# Patient Record
Sex: Female | Born: 1940
Health system: Southern US, Community
[De-identification: ages and names within clinical notes are randomized; demographics above are authoritative.]

## PROBLEM LIST (undated history)

## (undated) DIAGNOSIS — T7840XA Allergy, unspecified, initial encounter: Secondary | ICD-10-CM

## (undated) DIAGNOSIS — F329 Major depressive disorder, single episode, unspecified: Secondary | ICD-10-CM

## (undated) DIAGNOSIS — D649 Anemia, unspecified: Secondary | ICD-10-CM

## (undated) DIAGNOSIS — F419 Anxiety disorder, unspecified: Secondary | ICD-10-CM

## (undated) DIAGNOSIS — E785 Hyperlipidemia, unspecified: Secondary | ICD-10-CM

## (undated) DIAGNOSIS — F32A Depression, unspecified: Secondary | ICD-10-CM

## (undated) DIAGNOSIS — Z8719 Personal history of other diseases of the digestive system: Secondary | ICD-10-CM

## (undated) HISTORY — DX: Hyperlipidemia, unspecified: E78.5

## (undated) HISTORY — PX: POLYPECTOMY: SHX149

## (undated) HISTORY — PX: COLONOSCOPY: SHX174

## (undated) HISTORY — DX: Personal history of other diseases of the digestive system: Z87.19

## (undated) HISTORY — DX: Depression, unspecified: F32.A

## (undated) HISTORY — DX: Anxiety disorder, unspecified: F41.9

## (undated) HISTORY — DX: Anemia, unspecified: D64.9

## (undated) HISTORY — DX: Major depressive disorder, single episode, unspecified: F32.9

## (undated) HISTORY — DX: Allergy, unspecified, initial encounter: T78.40XA

---
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
## 1971-12-23 HISTORY — PX: APPENDECTOMY: SHX54

## 1971-12-23 HISTORY — PX: OVARIAN CYST SURGERY: SHX726

## 1999-04-24 ENCOUNTER — Other Ambulatory Visit: Admission: RE | Admit: 1999-04-24 | Discharge: 1999-04-24 | Payer: Self-pay | Admitting: Endocrinology

## 2001-07-19 ENCOUNTER — Ambulatory Visit (HOSPITAL_COMMUNITY): Admission: RE | Admit: 2001-07-19 | Discharge: 2001-07-19 | Payer: Self-pay | Admitting: Endocrinology

## 2001-07-19 ENCOUNTER — Encounter: Payer: Self-pay | Admitting: Endocrinology

## 2001-09-01 ENCOUNTER — Ambulatory Visit (HOSPITAL_COMMUNITY): Admission: RE | Admit: 2001-09-01 | Discharge: 2001-09-01 | Payer: Self-pay | Admitting: *Deleted

## 2001-09-01 ENCOUNTER — Encounter (INDEPENDENT_AMBULATORY_CARE_PROVIDER_SITE_OTHER): Payer: Self-pay | Admitting: *Deleted

## 2003-07-28 ENCOUNTER — Other Ambulatory Visit: Admission: RE | Admit: 2003-07-28 | Discharge: 2003-07-28 | Payer: Self-pay | Admitting: Obstetrics and Gynecology

## 2004-08-16 ENCOUNTER — Observation Stay (HOSPITAL_COMMUNITY): Admission: EM | Admit: 2004-08-16 | Discharge: 2004-08-19 | Payer: Self-pay | Admitting: Emergency Medicine

## 2004-08-28 ENCOUNTER — Encounter: Admission: RE | Admit: 2004-08-28 | Discharge: 2004-08-28 | Payer: Self-pay | Admitting: Endocrinology

## 2004-09-11 ENCOUNTER — Encounter: Payer: Self-pay | Admitting: Family Medicine

## 2004-10-17 ENCOUNTER — Encounter (INDEPENDENT_AMBULATORY_CARE_PROVIDER_SITE_OTHER): Payer: Self-pay | Admitting: *Deleted

## 2004-10-17 ENCOUNTER — Ambulatory Visit (HOSPITAL_COMMUNITY): Admission: RE | Admit: 2004-10-17 | Discharge: 2004-10-18 | Payer: Self-pay | Admitting: General Surgery

## 2005-06-18 ENCOUNTER — Other Ambulatory Visit: Admission: RE | Admit: 2005-06-18 | Discharge: 2005-06-18 | Payer: Self-pay | Admitting: Obstetrics and Gynecology

## 2006-04-29 IMAGING — CR DG CHEST 1V PORT
1 series · 1 of 1 positions shown · non-contrast
Comparison: None.

CLINICAL DATA: Chest pain.  
 PORTABLE CHEST 08/16/04

[view not recorded]
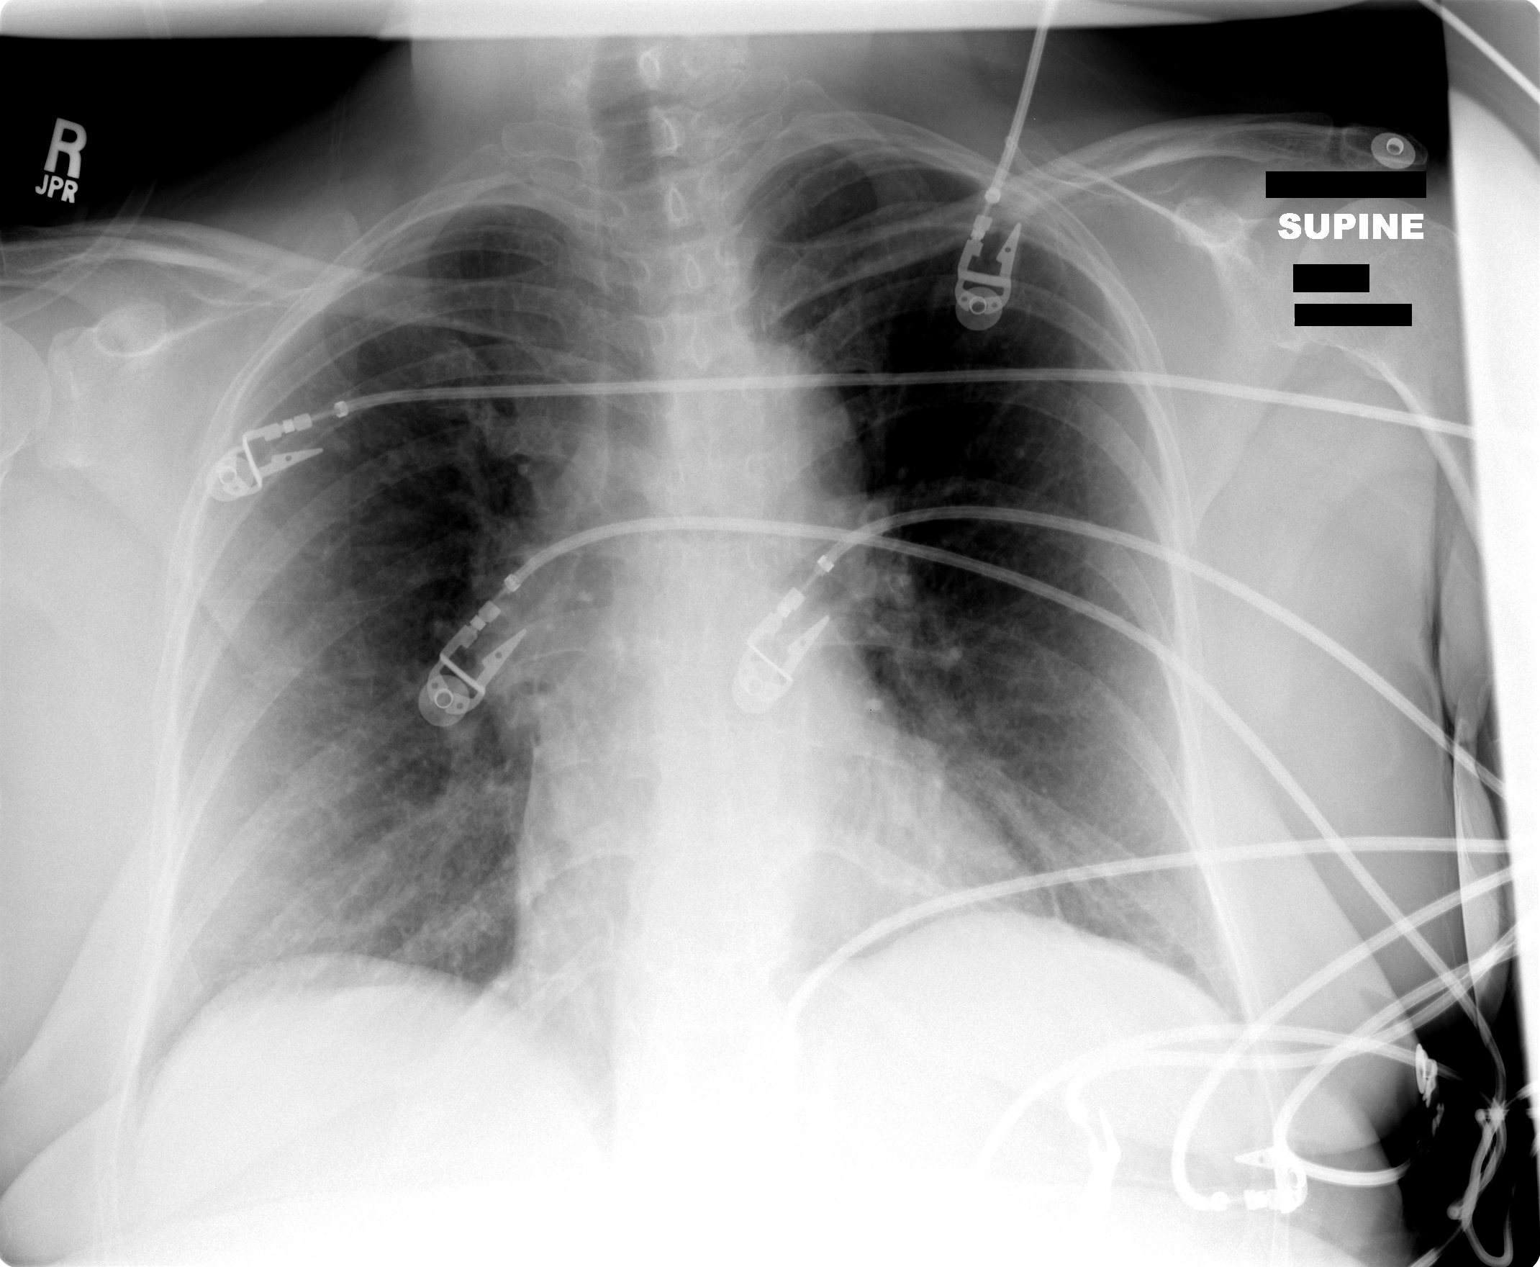

[1 of 1 positions shown; findings below may reference images not displayed]

FINDINGS: AP chest obtained at 1966 hours shows low volumes.  There is mild vascular congestion without focal infiltrate or pleural effusion.  Ill-defined opacity projects over the left heart border which may be external to the patient or related to rotation.  Cardio-pericardial silhouette is within normal limits for size.  Multiple telemetry leads overlie the chest. 
 IMPRESSION
 1.  Low volume rotated film.  Vascular congestion without frank pulmonary edema. 
 2.  Ill-defined density over the left heart.  
 3.  Follow-up two view chest x-ray is recommended to better characterize.

## 2006-12-22 HISTORY — PX: CHOLECYSTECTOMY: SHX55

## 2007-01-29 LAB — CONVERTED CEMR LAB: Pap Smear: NORMAL

## 2007-04-15 ENCOUNTER — Encounter: Payer: Self-pay | Admitting: Family Medicine

## 2008-12-06 ENCOUNTER — Emergency Department (HOSPITAL_COMMUNITY): Admission: EM | Admit: 2008-12-06 | Discharge: 2008-12-07 | Payer: Self-pay | Admitting: Emergency Medicine

## 2010-01-28 ENCOUNTER — Ambulatory Visit: Payer: Self-pay | Admitting: Family Medicine

## 2010-01-28 DIAGNOSIS — F329 Major depressive disorder, single episode, unspecified: Secondary | ICD-10-CM

## 2010-01-28 DIAGNOSIS — E785 Hyperlipidemia, unspecified: Secondary | ICD-10-CM

## 2010-01-28 DIAGNOSIS — F3289 Other specified depressive episodes: Secondary | ICD-10-CM

## 2010-01-28 DIAGNOSIS — Z8719 Personal history of other diseases of the digestive system: Secondary | ICD-10-CM

## 2010-01-28 DIAGNOSIS — M5430 Sciatica, unspecified side: Secondary | ICD-10-CM

## 2010-01-28 HISTORY — DX: Other specified depressive episodes: F32.89

## 2010-01-28 HISTORY — DX: Major depressive disorder, single episode, unspecified: F32.9

## 2010-01-28 HISTORY — DX: Hyperlipidemia, unspecified: E78.5

## 2010-01-31 ENCOUNTER — Ambulatory Visit: Payer: Self-pay | Admitting: Family Medicine

## 2010-02-01 LAB — CONVERTED CEMR LAB
Cholesterol: 251 mg/dL — ABNORMAL HIGH (ref 0–200)
HDL: 52.4 mg/dL (ref >39.00)
Total CHOL/HDL Ratio: 5
Triglycerides: 153 mg/dL — ABNORMAL HIGH (ref 0.0–149.0)
VLDL: 30.6 mg/dL (ref 0.0–40.0)

## 2011-01-23 NOTE — Assessment & Plan Note (Signed)
Summary: NEW TO EST//CCM   Vital Signs:  Patient profile:   70 year old female Menstrual status:  postmenopausal Height:      66 inches Weight:      232 pounds BMI:     37.58 Temp:     97.7 degrees F oral Pulse rate:   80 / minute Pulse rhythm:   regular Resp:     12 per minute BP sitting:   110 / 70  (left arm) Cuff size:   large  Vitals Entered By: Sid Falcon LPN (January 28, 2010 11:16 AM)  Nutrition Counseling: Patient's BMI is greater than 25 and therefore counseled on weight management options. CC: New to establish Is Patient Diabetic? No     Menstrual Status postmenopausal Last PAP Result normal   History of Present Illness: New patient to establish care.  Chronic problems include hyperlipidemia, history of IBS, past history depression. Previous rx with Zetia and stopped taking on her own. No side effects. No history of CAD. No prior use of statin. Reportedly had NMR profile at some point in the past.    IBS symptoms stable. No recent constipation or diarrhea. Appetite is good.  Remote history of depression. Denies any active depressive symptoms now.  Acute problem is intermittent left buttock pain occasionally radiating to the calf with pressure-like pain mild to moderate intensity and very intermittent. Symptoms tend to be worse early in the morning. No history of known sciatica. Denies any numbness or weakness. No loss of bladder or bowel control.  Past history otherwise reviewed and significant for cholecystectomy 2008, appendectomy 1973, tonsillectomy 1948. Family history states that for a sister with breast cancer another sister with some sort of valvular heart problem. Mother died age 40 of possible CHF. Hypertension in mother.  Patient is married has 3 children. Nonsmoker. No alcohol use.  Preventive Screening-Counseling & Management  Alcohol-Tobacco     Alcohol drinks/day: 0     Smoking Status: never  Caffeine-Diet-Exercise     Does Patient  Exercise: no  Past History:  Family History: Last updated: 01/28/2010 Family History of Arthritis Family History Breast cancer 1st degree relative, sister age 66 Heart disease, mother, paternal grandfather Family History Hypertension, mother Diabetes, maternal grandmother Crohns disease, brother  Social History: Last updated: 01/28/2010 Retired Married Never Smoked Alcohol use-no Regular exercise-no 3 pregnancies, 3 live births  Risk Factors: Alcohol Use: 0 (01/28/2010) Exercise: no (01/28/2010)  Risk Factors: Smoking Status: never (01/28/2010)  Past Medical History: Chicken pox Depression Hyperlipidemia Hay fever, allergies IBS  Past Surgical History: ruptured right ovarian cyst 1973 Appendectomy 1973 Cholecystectomy, 2008 Tonsillectomy, 1948 PMH-FH-SH reviewed for relevance  Family History: Family History of Arthritis Family History Breast cancer 1st degree relative, sister age 26 Heart disease, mother, paternal grandfather Family History Hypertension, mother Diabetes, maternal grandmother Crohns disease, brother  Social History: Retired Married Never Smoked Alcohol use-no Regular exercise-no 3 pregnancies, 3 live birthsSmoking Status:  never Does Patient Exercise:  no  Review of Systems  The patient denies anorexia, fever, weight loss, weight gain, chest pain, syncope, dyspnea on exertion, peripheral edema, prolonged cough, headaches, hemoptysis, abdominal pain, melena, hematochezia, severe indigestion/heartburn, hematuria, incontinence, muscle weakness, and depression.    Physical Exam  General:  Well-developed,well-nourished,in no acute distress; alert,appropriate and cooperative throughout examination Eyes:  pupils equal, pupils round, and pupils reactive to light.   Ears:  External ear exam shows no significant lesions or deformities.  Otoscopic examination reveals clear canals, tympanic membranes are intact bilaterally  without bulging,  retraction, inflammation or discharge. Hearing is grossly normal bilaterally. Mouth:  Oral mucosa and oropharynx without lesions or exudates.  Teeth in good repair. Neck:  No deformities, masses, or tenderness noted. Lungs:  Normal respiratory effort, chest expands symmetrically. Lungs are clear to auscultation, no crackles or wheezes. Heart:  Normal rate and regular rhythm. S1 and S2 normal without gallop, murmur, click, rub or other extra sounds. Extremities:  No clubbing, cyanosis, edema, or deformity noted with normal full range of motion of all joints.   Neurologic:  cranial nerves II-XII intact, strength normal in all extremities, and gait normal.   Skin:  Intact without suspicious lesions or rashes Cervical Nodes:  No lymphadenopathy noted   Impression & Recommendations:  Problem # 1:  HYPERLIPIDEMIA (ICD-272.4) schedule fasting labs.  Problem # 2:  DEPRESSION (ICD-311) stable off meds.  Problem # 3:  SCIATICA, LEFT (ICD-724.3) neuro exam nonfocal and currently stable.  Observe for now.  Problem # 4:  IRRITABLE BOWEL SYNDROME, HX OF (ICD-V12.79)  Patient Instructions: 1)  Schedule the following labs: 2)  lipid panel  272.4 3)  It is important that you exercise reguarly at least 20 minutes 5 times a week. If you develop chest pain, have severe difficulty breathing, or feel very tired, stop exercising immediately and seek medical attention.  4)  You need to lose weight. Consider a lower calorie diet and regular exercise.   Prevention & Chronic Care Immunizations   Influenza vaccine: Not documented    Tetanus booster: Not documented    Pneumococcal vaccine: given  (12/22/2008)    H. zoster vaccine: Not documented  Colorectal Screening   Hemoccult: Not documented    Colonoscopy: normal  (12/23/2003)  Other Screening   Pap smear: normal  (01/29/2007)    Mammogram: normal  (12/22/2006)    DXA bone density scan: Not documented   Smoking status: never   (01/28/2010)  Lipids   Total Cholesterol: Not documented   LDL: Not documented   LDL Direct: Not documented   HDL: Not documented   Triglycerides: Not documented    SGOT (AST): Not documented   SGPT (ALT): Not documented   Alkaline phosphatase: Not documented   Total bilirubin: Not documented  Self-Management Support :    Lipid self-management support: Not documented    Preventive Care Screening  Last Pneumovax:    Date:  12/22/2008    Results:  given   Pap Smear:    Date:  01/29/2007    Results:  normal   Mammogram:    Date:  12/22/2006    Results:  normal   Colonoscopy:    Date:  12/23/2003    Results:  normal

## 2011-01-30 ENCOUNTER — Encounter: Payer: Self-pay | Admitting: Family Medicine

## 2011-01-30 ENCOUNTER — Ambulatory Visit (INDEPENDENT_AMBULATORY_CARE_PROVIDER_SITE_OTHER): Payer: PRIVATE HEALTH INSURANCE | Admitting: Family Medicine

## 2011-01-30 DIAGNOSIS — R079 Chest pain, unspecified: Secondary | ICD-10-CM

## 2011-01-30 NOTE — Progress Notes (Signed)
  Subjective:    Patient ID: Heather Oneal, female    DOB: June 04, 1941, 70 y.o.   MRN: [id number]  HPI  Patient is seen with intermittent chest pains for the past 2 weeks. She relates increased stress with son who has alcohol problems. Patient has prior history of depression. No cardiac history. She relates symptoms at rest with intermittent pressure substernal without radiation. Duration can be several minutes to several hours. No clear provoking factors. Mild dyspnea not clearly related to chest pain symptoms. Denies any nausea or vomiting. Denies any appetite or weight changes. No melena. Mild indigestion.. No alleviating factors.  symptoms are nonprogressive.  No prior cardiac evaluation.   Review of Systems    Pt denies any appetite or weight change Denies hemoptysis, pleuritic pain, chronic cough, odynophagia, vomiting, abdominal pain, bloody stools, back pain Objective:   Physical Exam     Patient is alert pleasant cooperative obese  in no distress  Neck exam reveals no masses Chest wall nontender  Chest is clear to auscultation  Heart regular rhythm and rate  Abdomen is soft and nontender without mass  Extremities calves are nontender. No pitting edema Psych pt alert and oriented times 3.  Slightly depressed mood.    Assessment & Plan:  Atypical chest pain.  EKG sinus rhythm with no acute changes.  Suspect some of this may be stress related but she does have moderate risk factors for coronary disease.  Will set up nuclear stress test evaluation.

## 2011-01-30 NOTE — Patient Instructions (Signed)
Start baby aspirin one daily. Follow up immediately for any progressive shortness or breath or any worsening chest pain.

## 2011-02-05 ENCOUNTER — Other Ambulatory Visit: Payer: Self-pay | Admitting: Family Medicine

## 2011-02-10 ENCOUNTER — Encounter: Payer: Self-pay | Admitting: Cardiovascular Disease

## 2011-02-10 ENCOUNTER — Ambulatory Visit (INDEPENDENT_AMBULATORY_CARE_PROVIDER_SITE_OTHER): Payer: No Typology Code available for payment source | Admitting: Cardiovascular Disease

## 2011-02-10 DIAGNOSIS — R072 Precordial pain: Secondary | ICD-10-CM

## 2011-02-12 ENCOUNTER — Other Ambulatory Visit: Payer: Self-pay | Admitting: Cardiovascular Disease

## 2011-02-12 ENCOUNTER — Other Ambulatory Visit (INDEPENDENT_AMBULATORY_CARE_PROVIDER_SITE_OTHER): Payer: PRIVATE HEALTH INSURANCE

## 2011-02-12 ENCOUNTER — Encounter: Payer: Self-pay | Admitting: Cardiovascular Disease

## 2011-02-12 DIAGNOSIS — E785 Hyperlipidemia, unspecified: Secondary | ICD-10-CM

## 2011-02-12 LAB — LIPID PANEL
Total CHOL/HDL Ratio: 5
VLDL: 38.6 mg/dL (ref 0.0–40.0)

## 2011-02-12 LAB — HEPATIC FUNCTION PANEL
Alkaline Phosphatase: 67 U/L (ref 39–117)
Bilirubin, Direct: 0.1 mg/dL (ref 0.0–0.3)

## 2011-02-18 ENCOUNTER — Telehealth (INDEPENDENT_AMBULATORY_CARE_PROVIDER_SITE_OTHER): Payer: Self-pay | Admitting: *Deleted

## 2011-02-18 NOTE — Assessment & Plan Note (Signed)
Summary: np6/chest pain/[facility]/per terri dr. Caryl Never 2...   Visit Type:  Initial Consult Primary Provider:  Evelena Peat MD  CC:  chest pressure off and on, sometimes pain in chest, sob, and .  History of Present Illness: 70 yo female with history of hyperlipidemia who is referred today for evaluaiton of chest pressure. She tells me that she had mild CP last year. Approximately 2 months ago, she began to have an argument with her alcoholic son and began to feel chest pressure. This was substernal and and lasted for a few minutes. She has since then had almost daily episodes of this chest pressure. No associated diaphoresis or n/v. There is mild SOB. Sometimes exertional. Can last for up to 10-15 minutes. No near syncope or syncope.   Current Medications (verified): 1)  Aspirin 81 Mg Tbec (Aspirin) .... Take One Tablet By Mouth Daily 2)  Occassional Allegra or Claritin .... As Needed  Allergies (verified): No Known Drug Allergies  Past History:  Past Medical History: Reviewed history from 01/28/2010 and no changes required. Chicken pox Depression Hyperlipidemia Hay fever, allergies IBS  Past Surgical History: Reviewed history from 01/28/2010 and no changes required. ruptured right ovarian cyst 1973 Appendectomy 1973 Cholecystectomy, 2008 Tonsillectomy, 1948  Family History: Reviewed history from 01/28/2010 and no changes required. Family History of Arthritis Family History Breast cancer 1st degree relative, sister age 82 Heart disease, mother, paternal grandfather Family History Hypertension, mother Diabetes, maternal grandmother Crohns disease, brother  Mother-deceased, age 58.  Father-deceased, Parkinsons Disease 2 brothers-alive, no heart disease 2 sisters-alive, no heart disease  Social History: Reviewed history from 01/28/2010 and no changes required. Retired-Administrative Assistant Married, 3 children Never Smoked Alcohol use-no No  illicit drug use Regular exercise-no 3 pregnancies, 3 live births  Review of Systems       The patient complains of chest pain.  The patient denies fatigue, malaise, fever, weight gain/loss, vision loss, decreased hearing, hoarseness, palpitations, shortness of breath, prolonged cough, wheezing, sleep apnea, coughing up blood, abdominal pain, blood in stool, nausea, vomiting, diarrhea, heartburn, incontinence, blood in urine, muscle weakness, joint pain, leg swelling, rash, skin lesions, headache, fainting, dizziness, depression, anxiety, enlarged lymph nodes, easy bruising or bleeding, and environmental allergies.    Vital Signs:  Patient profile:   70 year old female Menstrual status:  postmenopausal Height:      66 inches Weight:      230.25 pounds BMI:     37.30 Pulse rate:   68 / minute Resp:     18 per minute BP sitting:   142 / 90  (left arm) Cuff size:   large  Vitals Entered By: Celestia Khat, CMA (February 10, 2011 11:43 AM)  Physical Exam  General:  General: Well developed, well nourished, NAD HEENT: OP clear, mucus membranes moist SKIN: warm, dry Neuro: No focal deficits Musculoskeletal: Muscle strength 5/5 all ext Psychiatric: Mood and affect normal Neck: No JVD, no carotid bruits, no thyromegaly, no lymphadenopathy. Lungs:Clear bilaterally, no wheezes, rhonci, crackles CV: RRR no murmurs, gallops rubs Abdomen: soft, NT, ND, BS present Extremities: No edema, pulses 2+.    EKG  Procedure date:  02/10/2011  Findings:      Sinus rhythm, rate 68 bpm. No ischemic changes.   Impression & Recommendations:  Problem # 1:  CHEST PAIN-PRECORDIAL (ICD-786.51)  Her risk factors for CAD include HTN, hyperlipidemia. She is having daily chest pain/pressure. Will arrange exercise myoview to exclude ischemia.    Her updated medication list  for this problem includes:    Aspirin 81 Mg Tbec (Aspirin) .Marland Kitchen... Take one tablet by mouth daily  Orders: EKG w/ Interpretation  (93000) Nuclear Stress Test (Nuc Stress Test)  Problem # 2:  HYPERLIPIDEMIA (ICD-272.4) Most recent check one year ago with LDL of 186, total chol 256. Will repeat fasting labs next week.   Patient Instructions: 1)  Your physician recommends that you schedule a follow-up appointment in: 2-3 weeks. 2)  Your physician recommends that you continue on your current medications as directed. Please refer to the Current Medication list given to you today. 3)  Your physician has requested that you have an exercise stress myoview.  For further information please visit [email or web address]  Please follow instruction sheet, as given. 4)  Your physician recommends that you return for a FASTING lipid profile: when you come for your stress test.

## 2011-02-19 ENCOUNTER — Encounter: Payer: Self-pay | Admitting: Cardiology

## 2011-02-19 ENCOUNTER — Ambulatory Visit (HOSPITAL_COMMUNITY): Payer: No Typology Code available for payment source | Attending: Cardiology

## 2011-02-19 DIAGNOSIS — R0789 Other chest pain: Secondary | ICD-10-CM

## 2011-02-19 DIAGNOSIS — R0609 Other forms of dyspnea: Secondary | ICD-10-CM

## 2011-02-19 DIAGNOSIS — R079 Chest pain, unspecified: Secondary | ICD-10-CM | POA: Insufficient documentation

## 2011-02-19 DIAGNOSIS — E785 Hyperlipidemia, unspecified: Secondary | ICD-10-CM | POA: Insufficient documentation

## 2011-02-20 ENCOUNTER — Telehealth: Payer: Self-pay | Admitting: Cardiovascular Disease

## 2011-02-27 NOTE — Assessment & Plan Note (Signed)
Summary: r/s myoview wt 230 ok per [facility] ht 5'7[facility]...  Nuclear Med Background Indications for Stress Test: Evaluation for Ischemia   History: Heart Catheterization  History Comments: '05 Cath:normal, EF=65%  Symptoms: Chest Pain, Chest Pain with Exertion, Chest Pressure, Chest Pressure with Exertion, Fatigue  Symptoms Comments: CP few seconds duration prior walking treadmill.   Nuclear Pre-Procedure Cardiac Risk Factors: Family History - CAD, Lipids Caffeine/Decaff Intake: None NPO After: 9:00 PM Lungs: Clear IV 0.9% NS with Angio Cath: 18g     IV Site: R Antecubital IV Started by: Stanton Kidney, EMT-P Chest Size (in) 40     Cup Size B     Height (in): 67 Weight (lb): 229 BMI: 36.00  Nuclear Med Study 1 or 2 day study:  1 day     Stress Test Type:  Stress Reading MD:  Marca Ancona, MD     Referring MD:  Alyson Ingles Resting Radionuclide:  Technetium 64m Tetrofosmin     Resting Radionuclide Dose:  11.0 mCi  Stress Radionuclide:  Technetium 33m Tetrofosmin     Stress Radionuclide Dose:  33.0 mCi   Stress Protocol Exercise Time (min):  6:00 min     Max HR:  139 bpm     Predicted Max HR:  151 bpm  Max Systolic BP: 169 mm Hg     Percent Max HR:  92.05 %     METS: 7.0 Rate Pressure Product:  95284    Stress Test Technologist:  Irean Hong,  RN     Nuclear Technologist:  Domenic Polite, CNMT  Rest Procedure  Myocardial perfusion imaging was performed at rest 45 minutes following the intravenous administration of Technetium 14m Tetrofosmin.  Stress Procedure  The patient exercised for six minutes, RPE=15.   The patient stopped due to DOE  and denied any chest pain.  There were nonspecific ST-T wave changes.  Technetium 21m Tetrofosmin was injected at peak exercise and myocardial perfusion imaging was performed after a brief delay.  QPS Raw Data Images:  Normal; no motion artifact; normal heart/lung ratio. Stress Images:  Normal homogeneous uptake in all  areas of the myocardium. Rest Images:  Normal homogeneous uptake in all areas of the myocardium. Subtraction (SDS):  There is no evidence of scar or ischemia. Transient Ischemic Dilatation:  0.90  (Normal <1.22)  Lung/Heart Ratio:  0.30  (Normal <0.45)  Quantitative Gated Spect Images QGS EDV:  76 ml QGS ESV:  20 ml QGS EF:  74 % QGS cine images:  Normal wall motion   Overall Impression  Exercise Capacity: Fair exercise capacity. BP Response: Normal blood pressure response. Clinical Symptoms: Exertional dyspnea ECG Impression: Insignificant upsloping ST segment depression. Overall Impression: Normal stress nuclear study.  Appended Document: r/s myoview wt 230 ok per [facility] 5'7[facility]... Normal stress. no ischemia. can we let her know? thanks, cdm  Appended Document: r/s myoview wt 230 ok per [facility] 5'7[facility]... LVMTCB*  Appended Document: r/s myoview wt 230 ok per [facility] 5'7[facility]... pt aware*

## 2011-02-27 NOTE — Progress Notes (Signed)
Summary: pt rtn call to whitney  Phone Note Call from Patient   Caller: Patient [phone] Reason for Call: Talk to Nurse Summary of Call: pt rtn call to [facility] Initial call taken by: Glynda Jaeger,  February 20, 2011 2:13 PM  Follow-up for Phone Call        patient aware of test results* Whitney Maeola Sarah RN  February 20, 2011 2:24 PM  Follow-up by: Whitney Maeola Sarah RN,  February 20, 2011 2:24 PM

## 2011-02-27 NOTE — Progress Notes (Signed)
Summary: Nuclear pre procedure  Phone Note Outgoing Call Call back at [facility] Phone [phone]   Call placed by: Rea College, CMA,  February 18, 2011 5:33 PM Call placed to: Patient Summary of Call: Reviewed information on Myoview Information Sheet (see scanned document for further details).  Spoke with patient.      Nuclear Med Background Indications for Stress Test: Evaluation for Ischemia   History: Heart Catheterization  History Comments: '05 Cath:normal, EF=65%  Symptoms: Chest Pain, Chest Pressure    Nuclear Pre-Procedure Cardiac Risk Factors: Family History - CAD, Lipids Height (in): 66

## 2011-03-03 ENCOUNTER — Telehealth: Payer: Self-pay | Admitting: Family Medicine

## 2011-03-03 NOTE — Telephone Encounter (Signed)
Please advise, OV?

## 2011-03-03 NOTE — Telephone Encounter (Signed)
Pt called and has hurt her lower back. Pt is req that Dr Caryl Never call in a med or something for muscle pain. Pls call in to [facility] on [address]

## 2011-03-03 NOTE — Telephone Encounter (Signed)
OK to call in Tramadol 50 mg 1-2 po q 6 hours prn, disp #60 with no refills and needs follow up if no better with that.

## 2011-03-04 ENCOUNTER — Telehealth: Payer: Self-pay | Admitting: *Deleted

## 2011-03-04 ENCOUNTER — Ambulatory Visit: Payer: PRIVATE HEALTH INSURANCE | Admitting: Cardiovascular Disease

## 2011-03-04 ENCOUNTER — Ambulatory Visit (INDEPENDENT_AMBULATORY_CARE_PROVIDER_SITE_OTHER): Payer: No Typology Code available for payment source | Admitting: Family Medicine

## 2011-03-04 ENCOUNTER — Encounter: Payer: Self-pay | Admitting: Family Medicine

## 2011-03-04 DIAGNOSIS — S335XXA Sprain of ligaments of lumbar spine, initial encounter: Secondary | ICD-10-CM

## 2011-03-04 MED ORDER — TRAMADOL HCL 50 MG PO TABS
ORAL_TABLET | ORAL | Status: DC
Start: 2011-03-04 — End: 2011-04-10

## 2011-03-04 NOTE — Progress Notes (Signed)
  Subjective:    Patient ID: Heather Oneal, female    DOB: March 13, 1941, 70 y.o.   MRN: [id number]  HPI  patient seen acute visit. Bending over yesterday flexing her back and felt a popping sensation. Sharp pain which started at that time. Improved today after Advil. 5 out 10 severity. Pain worse with back flexion. Some pain with sitting. Occasional radiation to the left. No numbness or weakness. No incontinence symptoms. Similar back injury in the past.   Review of Systems  Constitutional: Negative for fever, chills and activity change.  Cardiovascular: Negative for leg swelling.  Gastrointestinal: Negative for abdominal pain.  Genitourinary: Negative for dysuria.  Musculoskeletal: Positive for back pain. Negative for myalgias and joint swelling.  Neurological: Negative for weakness.       Objective:   Physical Exam  patient is alert in no distress.  Chest clear to auscultation Heart regular rhythm and rate Extremities no edema. Straight leg raise is negative Neuro exam sensory function normal to touch lower extremities. Full strength with plantar flexion dorsiflexion bilaterally. 2+ reflexes ankle and knee bilaterally.       Assessment & Plan:   acute low back strain. Stretches reviewed. Continue Advil and supplement with tramadol as needed. Touch base 2 weeks if no better

## 2011-03-04 NOTE — Telephone Encounter (Signed)
Pt in for OV today.

## 2011-03-04 NOTE — Patient Instructions (Signed)
Low Back Sprain with Rehab    A sprain is an injury in which a ligament is torn. The ligaments of the lower back are vulnerable to sprains. However, they are strong and require great force to be injured. These ligaments are important for stabilizing the spinal column. Sprains are classified into three categories. Grade 1 sprains cause pain, but the tendon is not lengthened. Grade 2 sprains include a lengthened ligament, due to the ligament being stretched or partially ruptured. With grade 2 sprains there is still function, although the function may be decreased. Grade 3 sprains involve a complete tear of the tendon or muscle, and function is usually impaired.   SYMPTOMS  Severe pain in the lower back.  Sometimes, a feeling of a "pop," "snap," or tear, at the time of injury.  Tenderness and sometimes swelling at the injury site.  Uncommonly, bruising (contusion) within 48 hours of injury.  Muscle spasms in the back.   CAUSES  Low back sprains occur when a force is placed on the ligaments that is greater than they can handle. Common causes of injury include:  Performing a stressful act while off-balance.  Repetitive stressful activities that involve movement of the lower back.  Direct hit (trauma) to the lower back.   RISK INCREASES WITH   Contact sports (football, wrestling).  Collisions (major skiing accidents).  Sports that require throwing or lifting (baseball, weightlifting).  Sports involving twisting of the spine (gymnastics, diving, tennis, golf).  Poor strength and flexibility.  Inadequate protection.  Previous back injury or surgery (especially fusion).   PREVENTIVE MEASURES   Wear properly fitted and padded protective equipment.  Warm up and stretch properly before activity.  Allow for adequate recovery between workouts.  Maintain physical fitness: l Strength, flexibility, and endurance. l Cardiovascular fitness.  Maintain a healthy body weight.     PROGNOSIS If treated properly, low back sprains usually heal with non-surgical treatment. The length of time for healing depends on the severity of the injury.    POSSIBLE COMPLICATIONS   Recurring symptoms, resulting in a chronic problem.  Chronic inflammation and pain in the low back.  Delayed healing or resolution of symptoms, especially if activity is resumed too soon.  Prolonged impairment.  Unstable or arthritic joints of the low back.   GENERAL TREATMENT CONSIDERATIONS  Treatment first involves the use of ice and medicine, to reduce pain and inflammation. The use of strengthening and stretching exercises may help reduce pain with activity. These exercises may be performed at home or with a therapist. Severe injuries may require referral to a therapist for further evaluation and treatment, such as ultrasound. Your caregiver may advise that you wear a back brace or corset, to help reduce pain and discomfort. Often, prolonged bed rest results in greater harm then benefit. Corticosteroid injections may be recommended. However, these should be reserved for the most serious cases. It is important to avoid using your back when lifting objects. At night, sleep on your back on a firm mattress, with a pillow placed under your knees. If non-surgical treatment is unsuccessful, surgery may be needed.    MEDICATION:   If pain medicine is needed, nonsteroidal anti-inflammatory medicines (aspirin and ibuprofen), or other minor pain relievers (acetaminophen), are often advised.   Do not take pain medicine for 7 days before surgery.   Prescription pain relievers may be given, if your caregiver thinks they are needed. Use only as directed and only as much as you need.  Ointments applied to  the skin may be helpful.  Corticosteroid injections may be given by your caregiver. These injections should be reserved for the most serious cases, because they may only be given a certain number of times.    HEAT AND COLD:   Cold treatment (icing) should be applied for 10 to 15 minutes every 2 to 3 hours for inflammation and pain, and immediately after activity that aggravates your symptoms. Use ice packs or an ice massage.  Heat treatment may be used before performing stretching and strengthening activities prescribed by your caregiver, physical therapist, or athletic trainer. Use a heat pack or a warm water soak.     SEEK MEDICAL CARE IF:   Symptoms get worse or do not improve in 2 to 4 weeks, despite treatment.  You develop numbness or weakness in either leg.  You lose bowel or bladder function.  Any of the following occur after surgery: fever, increased pain, swelling, redness, drainage of fluids, or bleeding in the affected area.  New, unexplained symptoms develop. (Drugs used in treatment may produce side effects.)     EXERCISES   RANGE OF MOTION AND STRETCHING EXERCISES - Low Back Sprain Most people with lower back pain will find that their symptoms get worse with excessive bending forward (flexion) or arching at the lower back (extension). The exercises that will help resolve your symptoms will focus on the opposite motion.    Your physician, physical therapist or athletic trainer will help you determine which exercises will be most helpful to resolve your lower back pain. Do not complete any exercises without first consulting with your caregiver. Discontinue any exercises which make your symptoms worse, until you speak to your caregiver.    If you have pain, numbness or tingling which travels down into your buttocks, leg or foot, the goal of the therapy is for these symptoms to move closer to your back and eventually resolve. Sometimes, these leg symptoms will get better, but your lower back pain may worsen. This is often an indication of progress in your rehabilitation. Be very alert to any changes in your symptoms and the activities in which you participated in the 24 hours prior  to the change. Sharing this information with your caregiver will allow him or her to most efficiently treat your condition.   These exercises may help you when beginning to rehabilitate your injury. Your symptoms may resolve with or without further involvement from your physician, physical therapist or athletic trainer. While completing these exercises, remember:   Restoring tissue flexibility helps normal motion to return to the joints. This allows healthier, less painful movement and activity.  An effective stretch should be held for at least 30 seconds.  A stretch should never be painful. You should only feel a gentle lengthening or release in the stretched tissue.      FLEXION RANGE OF MOTION AND STRETCHING EXERCISES:  STRETCH - Flexion, Single Knee to Chest   Lie on a firm bed or floor with both legs extended in front of you.  Keeping one leg in contact with the floor, bring your opposite knee to your chest. Hold your leg in place by either grabbing behind your thigh or at your knee.  Pull until you feel a gentle stretch in your low back. Hold __________ seconds.  Slowly release your grasp and repeat the exercise with the opposite side. Repeat __________ times. Complete this exercise __________ times per day.     STRETCH - Flexion, Double Knee to Chest  Lie on a firm bed or floor with both legs extended in front of you.  Keeping one leg in contact with the floor, bring your opposite knee to your chest.    Tense your stomach muscles to support your back and then lift your other knee to your chest. Hold your legs in place by either grabbing behind your thighs or at your knees.  Pull both knees toward your chest until you feel a gentle stretch in your low back. Hold __________ seconds.  Tense your stomach muscles and slowly return one leg at a time to the floor. Repeat __________ times. Complete this exercise __________ times per day.     STRETCH - Low Trunk Rotation   Lie  on a firm bed or floor. Keeping your legs in front of you, bend your knees so they are both pointed toward the ceiling and your feet are flat on the floor.  Extend your arms out to the side. This will stabilize your upper body by keeping your shoulders in contact with the floor.  Gently and slowly drop both knees together to one side until you feel a gentle stretch in your low back. Hold for __________ seconds.   Tense your stomach muscles to support your lower back as you bring your knees back to the starting position. Repeat the exercise to the other side. Repeat __________ times. Complete this exercise __________ times per day      EXTENSION RANGE OF MOTION AND FLEXIBILITY EXERCISES:  STRETCH - Extension, Prone on Elbows   Lie on your stomach on the floor, a bed will be too soft. Place your palms about shoulder width apart and at the height of your head.  Place your elbows under your shoulders. If this is too painful, stack pillows under your chest.  Allow your body to relax so that your hips drop lower and make contact more completely with the floor.  Hold this position for __________ seconds.  Slowly return to lying flat on the floor. Repeat __________ times. Complete this exercise __________ times per day.     RANGE OF MOTION - Extension, Prone Press Ups   Lie on your stomach on the floor, a bed will be too soft. Place your palms about shoulder width apart and at the height of your head.  Keeping your back as relaxed as possible, slowly straighten your elbows while keeping your hips on the floor. You may adjust the placement of your hands to maximize your comfort. As you gain motion, your hands will come more underneath your shoulders.  Hold this position __________ seconds.  Slowly return to lying flat on the floor. Repeat __________ times. Complete this exercise __________ times per day.     RANGE OF MOTION- Quadruped, Neutral Spine   Assume a hands and knees position  on a firm surface. Keep your hands under your shoulders and your knees under your hips. You may place padding under your knees for comfort.    Drop your head and point your tailbone toward the ground below you. This will round out your lower back like an angry cat. Hold this position for __________ seconds.   Slowly lift your head and release your tail bone so that your back sags into a large arch, like an old horse.  Hold this position for __________ seconds.   Repeat this until you feel limber in your low back.  Now, find your "sweet spot." This will be the most comfortable position somewhere between the two previous  positions. This is your neutral spine. Once you have found this position, tense your stomach muscles to support your low back.  Hold this position for __________ seconds. Repeat __________ times. Complete this exercise __________ times per day.      STRENGTHENING EXERCISES - Low Back Sprain These exercises may help you when beginning to rehabilitate your injury. These exercises should be done near your "sweet spot." This is the neutral, low-back arch, somewhere between fully rounded and fully arched, that is your least painful position. When performed in this safe range of motion, these exercises can be used for people who have either a flexion or extension based injury. These exercises may resolve your symptoms with or without further involvement from your physician, physical therapist or athletic trainer. While completing these exercises, remember:   Muscles can gain both the endurance and the strength needed for everyday activities through controlled exercises.  Complete these exercises as instructed by your physician, physical therapist or athletic trainer. Increase the resistance and repetitions only as guided.  You may experience muscle soreness or fatigue, but the pain or discomfort you are trying to eliminate should never worsen during these exercises. If this pain does  worsen, stop and make certain you are following the directions exactly. If the pain is still present after adjustments, discontinue the exercise until you can discuss the trouble with your caregiver.     STRENGTHENING - Deep Abdominals, Pelvic Tilt   Lie on a firm bed or floor. Keeping your legs in front of you, bend your knees so they are both pointed toward the ceiling and your feet are flat on the floor.  Tense your lower abdominal muscles to press your low back into the floor.  This motion will rotate your pelvis so that your tail bone is scooping upwards rather than pointing at your feet or into the floor. With a gentle tension and even breathing, hold this position for __________ seconds. Repeat __________ times. Complete this exercise __________ times per day.      STRENGTHENING - Abdominals, Crunches   Lie on a firm bed or floor. Keeping your legs in front of you, bend your knees so they are both pointed toward the ceiling and your feet are flat on the floor. Cross your arms over your chest.    Slightly tip your chin down without bending your neck.  Tense your abdominals and slowly lift your trunk high enough to just clear your shoulder blades. Lifting higher can put excessive stress on the lower back and does not further strengthen your abdominal muscles.  Control your return to the starting position. Repeat __________ times. Complete this exercise __________ times per day.     STRENGTHENING - Quadruped, Opposite UE/LE Lift   Assume a hands and knees position on a firm surface. Keep your hands under your shoulders and your knees under your hips. You may place padding under your knees for comfort.    Find your neutral spine and gently tense your abdominal muscles so that you can maintain this position. Your shoulders and hips should form a rectangle that is parallel with the floor and is not twisted.   Keeping your trunk steady, lift your right hand no higher than your shoulder  and then your left leg no higher than your hip. Make sure you are not holding your breath. Hold this position for __________ seconds.  Continuing to keep your abdominal muscles tense and your back steady, slowly return to your starting position. Repeat with the  opposite arm and leg. Repeat __________ times. Complete this exercise __________ times per day.      STRENGTHENING - Abdominals and Quadriceps, Straight Leg Raise   Lie on a firm bed or floor with both legs extended in front of you.  Keeping one leg in contact with the floor, bend the other knee so that your foot can rest flat on the floor.  Find your neutral spine, and tense your abdominal muscles to maintain your spinal position throughout the exercise.  Slowly lift your straight leg off the floor about 6 inches for a count of 15, making sure to not hold your breath.  Still keeping your neutral spine, slowly lower your leg all the way to the floor.   Repeat this exercise with each leg __________ times. Complete this exercise __________ times per day.     POSTURE AND BODY MECHANICS CONSIDERATIONS - Low Back Sprain Keeping correct posture when sitting, standing or completing your activities will reduce the stress put on different body tissues, allowing injured tissues a chance to heal and limiting painful experiences. The following are general guidelines for improved posture. Your physician or physical therapist will provide you with any instructions specific to your needs. While reading these guidelines, remember:  The exercises prescribed by your provider will help you have the flexibility and strength to maintain correct postures.  The correct posture provides the best environment for your joints to work. All of your joints have less wear and tear when properly supported by a spine with good posture. This means you will experience a healthier, less painful body.  Correct posture must be practiced with all of your activities,  especially prolonged sitting and standing. Correct posture is as important when doing repetitive low-stress activities (typing) as it is when doing a single heavy-load activity (lifting).     RESTING POSITIONS Consider which positions are most painful for you when choosing a resting position. If you have pain with flexion-based activities (sitting, bending, stooping, squatting), choose a position that allows you to rest in a less flexed posture. You would want to avoid curling into a fetal position on your side. If your pain worsens with extension-based activities (prolonged standing, working overhead), avoid resting in an extended position such as sleeping on your stomach. Most people will find more comfort when they rest with their spine in a more neutral position, neither too rounded nor too arched. Lying on a non-sagging bed on your side with a pillow between your knees, or on your back with a pillow under your knees will often provide some relief.  Keep in mind, being in any one position for a prolonged period of time, no matter how correct your posture, can still lead to stiffness.    PROPER SITTING POSTURE In order to minimize stress and discomfort on your spine, you must sit with correct posture. Sitting with good posture should be effortless for a healthy body. Returning to good posture is a gradual process. Many people can work toward this most comfortably by using various supports until they have the flexibility and strength to maintain this posture on their own.   When sitting with proper posture, your ears will fall over your shoulders and your shoulders will fall over your hips. You should use the back of the chair to support your upper back. Your lower back will be in a neutral position, just slightly arched. You may place a small pillow or folded towel at the base of your lower back for  support.    When working at a desk, create an environment that supports good, upright posture.  Without extra support, muscles tire, which leads to excessive strain on joints and other tissues. Keep these recommendations in mind:   CHAIR:    A chair should be able to slide under your desk when your back makes contact with the back of the chair. This allows you to work closely.  The chair's height should allow your eyes to be level with the upper part of your monitor and your hands to be slightly lower than your elbows.      BODY POSITION  Your feet should make contact with the floor. If this is not possible, use a foot rest.  Keep your ears over your shoulders. This will reduce stress on your neck and low back.     INCORRECT SITTING POSTURES  If you are feeling tired and unable to assume a healthy sitting posture, do not slouch or slump. This puts excessive strain on your back tissues, causing more damage and pain. Healthier options include:  Using more support, like a lumbar pillow.  Switching tasks to something that requires you to be upright or walking.  Talking a brief walk.  Lying down to rest in a neutral-spine position.      PROLONGED STANDING WHILE SLIGHTLY LEANING FORWARD  When completing a task that requires you to lean forward while standing in one place for a long time, place either foot up on a stationary 2-4 inch high object to help maintain the best posture. When both feet are on the ground, the lower back tends to lose its slight inward curve. If this curve flattens (or becomes too large), then the back and your other joints will experience too much stress, tire more quickly, and can cause pain.       CORRECT STANDING POSTURES Proper standing posture should be assumed with all daily activities, even if they only take a few moments, like when brushing your teeth. As in sitting, your ears should fall over your shoulders and your shoulders should fall over your hips. You should keep a slight tension in your abdominal muscles to brace your spine. Your tailbone  should point down to the ground, not behind your body, resulting in an over-extended swayback posture.      INCORRECT STANDING POSTURES  Common incorrect standing postures include a forward head, locked knees and/or an excessive swayback.     WALKING Walk with an upright posture. Your ears, shoulders and hips should all line-up.     PROLONGED ACTIVITY IN A FLEXED POSITION When completing a task that requires you to bend forward at your waist or lean over a low surface, try to find a way to stabilize 3 out of 4 of your limbs. You can place a hand or elbow on your thigh or rest a knee on the surface you are reaching across. This will provide you more stability, so that your muscles do not tire as quickly. By keeping your knees relaxed, or slightly bent, you will also reduce stress across your lower back.     CORRECT LIFTING TECHNIQUES DO :   Assume a wide stance. This will provide you more stability and the opportunity to get as close as possible to the object which you are lifting.  Tense your abdominals to brace your spine. Bend at the knees and hips. Keeping your back locked in a neutral-spine position, lift using your leg muscles. Lift with your legs, keeping your  back straight.  Test the weight of unknown objects before attempting to lift them.  Try to keep your elbows locked down at your sides in order get the best strength from your shoulders when carrying an object.  Always ask for help when lifting heavy or awkward objects.    INCORRECT LIFTING TECHNIQUES DO NOT:   Lock your knees when lifting, even if it is a small object.  Bend and twist. Pivot at your feet or move your feet when needing to change directions.  Assume that you can safely pick up even a paperclip without proper posture.   Document Released: 12/08/2005  Document Re-Released: 10/05/2009 [facility] Patient Information 2011 [place], [place].

## 2011-03-24 ENCOUNTER — Encounter: Payer: Self-pay | Admitting: Family Medicine

## 2011-03-24 ENCOUNTER — Ambulatory Visit (INDEPENDENT_AMBULATORY_CARE_PROVIDER_SITE_OTHER): Payer: No Typology Code available for payment source | Admitting: Family Medicine

## 2011-03-24 VITALS — BP 140/90 | Temp 98.4°F

## 2011-03-24 DIAGNOSIS — M25569 Pain in unspecified knee: Secondary | ICD-10-CM

## 2011-03-24 MED ORDER — MELOXICAM 15 MG PO TABS: 15.0000 mg | ORAL_TABLET | Freq: Every day | ORAL | Status: DC

## 2011-03-24 NOTE — Progress Notes (Signed)
  Subjective:    Patient ID: Heather Oneal, female    DOB: 1941-05-14, 70 y.o.   MRN: [id number]  HPI L knee pain 3 days duration.  Onset over the weekend.  No injury.  Location is posterior knee and severity is mild to moderate.  Symptoms are somewhat intermittent and worse with walking and movement.  No ecchymosis or edema noted.  No locking or giving way.  Tried Advil with mild relief.  No history of similar problem.  Symptoms noted after about 4 hours of driving on Thursday and 2 hours driving on Friday.   Review of Systems  Constitutional: Negative for fever and appetite change.  Respiratory: Negative for shortness of breath.   Cardiovascular: Negative for chest pain.  Musculoskeletal: Negative for back pain, joint swelling and gait problem.  Neurological: Negative for weakness.  Hematological: Negative for adenopathy. Does not bruise/bleed easily.       Objective:   Physical Exam  Constitutional: She is oriented to person, place, and time. She appears well-developed and well-nourished.  Cardiovascular: Normal rate, regular rhythm and normal heart sounds.   No murmur heard. Pulmonary/Chest: Effort normal and breath sounds normal. No respiratory distress. She has no wheezes. She has no rales.  Musculoskeletal: She exhibits no edema.       L knee no effusion.  No ecchymosis, erythema, or warmth.  Full ROM.  Ligaments all intact.  No medial or lateral joint line tenderness.  No palpable popliteal mass. No leg edema or any color changes.  No calf or thigh tenderness.  Neurological: She is alert and oriented to person, place, and time.  Skin: No rash noted.          Assessment & Plan:  L posterior knee pain-?posterior meniscus injury.  Clinically, nothing to suggest DVT.  Trial of Mobic 15 mg daily and consider x-rays if no better in 2 weeks.

## 2011-04-09 ENCOUNTER — Encounter: Payer: Self-pay | Admitting: Cardiovascular Disease

## 2011-04-10 ENCOUNTER — Ambulatory Visit (INDEPENDENT_AMBULATORY_CARE_PROVIDER_SITE_OTHER): Payer: No Typology Code available for payment source | Admitting: Cardiovascular Disease

## 2011-04-10 ENCOUNTER — Encounter: Payer: Self-pay | Admitting: Cardiovascular Disease

## 2011-04-10 ENCOUNTER — Encounter (INDEPENDENT_AMBULATORY_CARE_PROVIDER_SITE_OTHER): Payer: No Typology Code available for payment source | Admitting: Cardiology

## 2011-04-10 DIAGNOSIS — R079 Chest pain, unspecified: Secondary | ICD-10-CM

## 2011-04-10 DIAGNOSIS — M79605 Pain in left leg: Secondary | ICD-10-CM

## 2011-04-10 DIAGNOSIS — M7989 Other specified soft tissue disorders: Secondary | ICD-10-CM

## 2011-04-10 DIAGNOSIS — M79609 Pain in unspecified limb: Secondary | ICD-10-CM

## 2011-04-10 NOTE — Progress Notes (Signed)
History of Present Illness:70 yo female with history of hyperlipidemia who is here today for cardiac follow up. I saw her as a new patient 2 months ago for evaluaiton of chest pressure. She told  me that she had mild CP last year. Approximately 2 months prior to the first visit in our office, she began to have an argument with her alcoholic son and began to feel chest pressure. This was substernal and and lasted for a few minutes. She has since then had almost daily episodes of this chest pressure. No associated diaphoresis or n/v. There is mild SOB. Sometimes exertional. Can last for up to 10-15 minutes. No near syncope or syncope. At the first visit, I ordered an exercise nuclear study. She is here today for follow up. Her stress test was normal. She did fall a few months ago and hurt both knees. She continues to have some pain behind her left knee. Her chest pain has not recurred. Overall feeling well except for knee pain.   Past Medical History  Diagnosis Date  . Depression   . Allergy   . History of IBS   . Hyperlipidemia   . HYPERLIPIDEMIA 01/28/2010  . DEPRESSION 01/28/2010    Past Surgical History  Procedure Date  . Ovarian cyst surgery 1973    ruptured ovarian cyst  . Appendectomy 1973  . Cholecystectomy 2008  . Tonsillectomy 1948    Current Outpatient Prescriptions  Medication Sig Dispense Refill  . aspirin 81 MG tablet Take 81 mg by mouth daily.        . fexofenadine (ALLEGRA) 180 MG tablet Take 180 mg by mouth daily.        . Ibuprofen (ADVIL PO) Take 2 tablets by mouth daily. Does  Not know dose, for back pain daily        . DISCONTD: meloxicam (MOBIC) 15 MG tablet Take 1 tablet (15 mg total) by mouth daily.  30 tablet  2  . DISCONTD: traMADol (ULTRAM) 50 MG tablet 1-2 po q 6 hours prn   60 tablet  1    No Known Allergies  History   Social History  . Marital Status: Married    Spouse Name: N/A    Number of Children: N/A  . Years of Education: N/A   Occupational  History  . Not on file.   Social History Main Topics  . Smoking status: Never Smoker   . Smokeless tobacco: Not on file  . Alcohol Use: Not on file  . Drug Use: Not on file  . Sexually Active: Not on file   Other Topics Concern  . Not on file   Social History Narrative  . No narrative on file    Family History  Problem Relation Age of Onset  . Heart disease Mother   . Hypertension Mother   . Cancer Sister 9    breast  . Crohn's disease Brother   . Diabetes Maternal Grandmother   . Heart disease Paternal Grandfather     Review of Systems:  As stated in the HPI and otherwise negative.   BP 132/80  Pulse 64  Ht 5\' 7"  (1.702 m)  Wt 232 lb (105.235 kg)  BMI 36.34 kg/m2  Physical Examination: General: Well developed, well nourished, NAD HEENT: OP clear, mucus membranes moist SKIN: warm, dry. No rashes. Neuro: No focal deficits Musculoskeletal: Muscle strength 5/5 all ext Psychiatric: Mood and affect normal Neck: No JVD, no carotid bruits, no thyromegaly, no lymphadenopathy. Lungs:Clear bilaterally, no wheezes,  rhonci, crackles Cardiovascular: Regular rate and rhythm. No murmurs, gallops or rubs. Abdomen:Soft. Bowel sounds present. Non-tender.  Extremities: No lower extremity edema. Pulses are 2 + in the bilateral DP/PT.  Nuclear Stress Test: Stress Procedure   The patient exercised for six minutes, RPE=15.   The patient stopped due to DOE  and denied any chest pain.  There were nonspecific ST-T wave changes.  Technetium 55m Tetrofosmin was injected at peak exercise and myocardial perfusion imaging was performed after a brief delay.  QPS  Raw Data Images:  Normal; no motion artifact; normal heart/lung ratio. Stress Images:  Normal homogeneous uptake in all areas of the myocardium. Rest Images:  Normal homogeneous uptake in all areas of the myocardium. Subtraction (SDS):  There is no evidence of scar or ischemia. Transient Ischemic Dilatation:  0.90  (Normal  <1.22)  Lung/Heart Ratio:  0.30  (Normal <0.45)  Quantitative Gated Spect Images  QGS EDV:  76 ml QGS ESV:  20 ml QGS EF:  74 % QGS cine images:  Normal wall motion   Overall Impression   Exercise Capacity: Fair exercise capacity. BP Response: Normal blood pressure response. Clinical Symptoms: Exertional dyspnea ECG Impression: Insignificant upsloping ST segment depression. Overall Impression: Normal stress nuclear study.

## 2011-04-10 NOTE — Assessment & Plan Note (Signed)
Will get venous doppler of left lower extremity  to exclude DVT.

## 2011-04-10 NOTE — Patient Instructions (Signed)
Your physician recommends that you schedule a follow-up appointment as needed.  Your physician has requested that you have a lower extremity venous duplex. This test is an ultrasound of the veins in the legs. It looks at venous blood flow that carries blood from the heart to the legs or arms. Allow one hour for a Lower Venous exam. Allow thirty minutes for an Upper Venous exam. There are no restrictions or special instructions.

## 2011-04-10 NOTE — Assessment & Plan Note (Signed)
Non-cardiac. Stress test normal. No recurrence of pain. No further workup at this time.

## 2011-04-14 ENCOUNTER — Encounter: Payer: Self-pay | Admitting: Cardiovascular Disease

## 2011-05-09 NOTE — Procedures (Signed)
[place]. [facility]  Patient:    Heather Oneal, Heather Oneal Visit Number: [id number] MRN: [id number]          Service Type: END Location: ENDO Attending Physician:  Sabino Gasser Dictated by:   Sabino Gasser, M.D. Admit Date:  09/01/2001                             Procedure Report  PROCEDURE:  Upper endoscopy.  INDICATIONS:  GERD.  ANESTHESIA:  Demerol 70 mg, Versed 7.5 mg.  DESCRIPTION OF PROCEDURE:  With patient mildly sedated in the left lateral decubitus position, the Olympus videoscopic endoscope was inserted in the mouth, passed under direct vision through the esophagus.  Distal esophagus showed changes of esophagitis with some irregularity of the Z-line above a hiatal hernia, and this was photographed.  We subsequently biopsied these areas to rule out Barretts.  We entered into the stomach through the hiatal hernia.  Fundus, body, antrum, duodenal bulb, second portion of duodenum were well-visualized.  From this point the endoscope was slowly withdrawn, taking circumferential views of the entire duodenal mucosa until the endoscope had been pulled back into the stomach, placed in retroflexion to view the stomach from below, and the hiatal hernia was once again visualized and photographed, as evidenced by complete wrap of the GE junction around the endoscope.  The endoscope was then straightened and withdrawn, taking circumferential views of the entire gastric and esophageal mucosa, stopping in the stomach, where two polyps were seen in the stomach.  Both were photographed.  Both were biopsied. The endoscope was withdrawn, and patients vital signs and pulse oximetry remained stable.  The patient tolerated the procedure well and without apparent complications.  FINDINGS:  Gastric polyps in the fundus of the stomach, await biopsy report. Patient will call me for results and follow up as an outpatient.  Proceed to colonoscopy. Dictated by:   Sabino Gasser,  M.D. Attending Physician:  Sabino Gasser DD:  09/01/01 TD:  09/01/01 Job: [id number]

## 2011-05-09 NOTE — Cardiovascular Report (Signed)
NAME:  Heather Oneal, Heather Oneal                        ACCOUNT NO.:  [id number]   MEDICAL RECORD NO.:  [id number]                   PATIENT TYPE:  INP   LOCATION:  3743                                 FACILITY:  [facility]   PHYSICIAN:  Salvadore Farber, M.D. [facility]         DATE OF BIRTH:  24-Apr-1941   DATE OF PROCEDURE:  DATE OF DISCHARGE:                              CARDIAC CATHETERIZATION   PROCEDURE:  Left-heart catheterization, left ventriculography, coronary  angiography.   INDICATION:  [name] is a 70 year old lady with atherosclerotic risk  factor only of dyslipidemia who presents with several episodes of chest  discomfort occurring several hours after exertion, but not with exertion  itself.  She was admitted to the hospital after a particularly severe  episode, and ruled out for myocardial infarction by serial enzymes and  electrocardiogram.  She was referred for diagnostic angiography to  definitively exclude coronary disease as an etiology of her chest  discomfort.   PROCEDURAL TECHNIQUE:  Informed consent was obtained.  Under 1% lidocaine  local anesthesia, a 6-French sheath was placed in the right common femoral  artery using the modified Seldinger technique.  Diagnostic angiography and  ventriculography were performed using JL-4, JR-4 and pigtail catheters.  The  patient tolerated the procedure well and was transferred to the holding room  in stable condition.  Sheaths will be removed there.   COMPLICATIONS:  None.   FINDINGS:  1.  LV 125/7/11.  Ejection fraction 65% without regional wall motion      abnormality.  2.  No aortic stenosis or mitral regurgitation.  3.  LEFT MAIN:  Angiographically normal.  4.  LAD:  A moderate-sized vessel giving rise to four small diagonal      branches.  It is angiographically normal.  5.  CIRCUMFLEX:  Moderate-size vessel giving rise to two obtuse marginals.      It is angiographically normal.  6.  RCA:  A moderate-size dominant vessel. It  is angiographically normal.   IMPRESSION/RECOMMENDATION:  1.  [name] has angiographically normal coronary arteries with normal      left ventricular size and systolic function.  2.  I suspect a gastrointestinal etiology to her chest discomfort.                                               Salvadore Farber, M.D. [facility]    WED/MEDQ  D:  08/19/2004  T:  08/19/2004  Job:  [id number]   cc:   Brooke Bonito, M.D.  [address]  [place]  [place] [address]  Fax: [phone]   Olga Millers, M.D. [facility]

## 2011-05-09 NOTE — Op Note (Signed)
NAMEKENSLEY, VALLADARES NO.:  [id number]   MEDICAL RECORD NO.:  [id number]          PATIENT TYPE:  OIB   LOCATION:  5705                         FACILITY:  [facility]   PHYSICIAN:  Jimmye Norman III, M.D.  DATE OF BIRTH:  Mar 21, 1941   DATE OF PROCEDURE:  10/17/2004  DATE OF DISCHARGE:                                 OPERATIVE REPORT   PREOPERATIVE DIAGNOSIS:  Symptomatic cholelithiasis.   POSTOPERATIVE DIAGNOSIS:  Symptomatic cholelithiasis.   PROCEDURE:  Laparoscopic cholecystectomy with intraoperative cholangiogram.   SURGEON:  Jimmye Norman, M.D.   ASSISTANT:  Gabrielle Dare. Janee Morn, M.D.   ANESTHESIA:  General endotracheal anesthesia.   ESTIMATED BLOOD LOSS:  Less than 20 mL.   COMPLICATIONS:  None.   CONDITION:  Stable.   FINDINGS:  Normal cholangiogram.  Good flow.  Small ducts.  No evidence of  obstruction.   INDICATIONS FOR PROCEDURE:  The patient is a 70 year old with some upper  abdominal pain and an ultrasound demonstrating gallstones who now comes in  for an elective laparoscopic cholecystectomy.   FINDINGS:  Gallbladder was not acutely inflamed.  She had normal anatomy.   DESCRIPTION OF PROCEDURE:  The patient was taken to the operating room and  placed on the table in supine position.  After an adequate endotracheal  anesthetic was administered, she was prepped and draped in the usual sterile  manner exposing the midline and the right upper quadrant of her abdomen.   A supraumbilical curvilinear incision was made using #11 blade and taken  down into the subcutaneous tissue.  There was an umbilical hernia that was  encountered, however, we did not enter into it as it went directly into the  umbilicus and we stayed around it for our entrance point.  Once we had  gotten down to the midline fascia, Veress needle  was passed into the  peritoneal cavity while tenting up on the anterior abdominal wall with sharp  towel clamps.  It was confirmed to be in  adequate position using the saline  test, then carbon dioxide gas was instilled into the peritoneal cavity up to  a maximal intra-abdominal pressure of 15 mmHg.   We placed the patient in seep reverse Trendelenburg position.  Her left side  was tilted down.  Then two right costal margin 5 mm cannulas and a  subxiphoid 11 and 12 mm cannula passed under direct vision.  Once all those  cannulas were in place, the patient was placed more with left side tilted  down, more heads-up and the dissection begun.   The patient's gallbladder was somewhat on a mesentery. We were able to grasp  it at the dome and also in the body and infundibulum using ratcheted  graspers through the lateral most 5 mm cannula.  Once this was done, we were  able to dissect out the cystic duct and the cystic artery and enclose in a  triangle of Calot and hepatic duodenal triangle.  This peritoneum was  dissected away so that we had adequate windows around both the artery and  the duct.  Once this was done, we  did a cholangiogram through a ___________  while the proximal gallbladder side of the duct was clamped.  This  cholangiogram demonstrated good flow into the duodenum.  No evidence of  obstruction.  Normal size duct and no evidence of stones.   We subsequently removed the [facility] from the __________.  We  endoclipped the distal cystic duct triply and then transected the duct using  scissors.  We also transected the cystic artery which had been proximally  doubly clipped and distally clipped also.  We then dissected the gallbladder  out of the hepatic bed with minimal difficulty putting it into an EndoCatch  bag and bringing it out through the supraumbilical site.   Once this was done, we irrigated the gallbladder bed with saline where there  was minimal bleeding.  We used less than a saline of irrigant.   Once this was done, we removed all cannulas, allowed the gas to escape  through the sites and  once this was done, we flattened the patient out then  repaired a supraumbilical fascia using figure-of-eight stitch of 0 Vicryl.  The skin at all sites were closed using a running subcuticular stitch of 4-0  Vicryl after injecting each site with 0.25% Marcaine with epinephrine.  Sterile dressings were applied to all wounds.  The patient was taken to  recovery room in stable condition.  All sponge, needle and instrument counts  were correct.       JW/MEDQ  D:  10/17/2004  T:  10/17/2004  Job:  [id number]

## 2011-05-09 NOTE — Procedures (Signed)
[place]. [facility]  Patient:    Heather Oneal, Heather Oneal Visit Number: [id number] MRN: [id number]          Service Type: END Location: ENDO Attending Physician:  Sabino Gasser Dictated by:   Sabino Gasser, M.D. Proc. Date: 09/01/01 Admit Date:  09/01/2001                             Procedure Report  PROCEDURE PERFORMED:  Colonoscopy.  ENDOSCOPIST:  Sabino Gasser, M.D.  INDICATIONS FOR PROCEDURE:  Rectal bleeding.  ANESTHESIA:  None further given.  DESCRIPTION OF PROCEDURE:  With the patient mildly sedated in the left lateral decubitus position, the Olympus videoscopic colonoscope was inserted in the rectum and passed under direct vision into the cecum.  The cecum was identified by the ileocecal valve and appendiceal orifice, both of which were photographed.  From this point, the colonoscope was slowly withdrawn, taking circumferential views of the entire colonic mucosa stopping in the rectum which appeared normal on direct view and showed internal hemorrhoids on retroflex view.  The endoscope was straightened and withdrawn.  Patients vital signs and pulse oximeter remained stable.  The patient tolerated the procedure well and without apparent complications.  FINDINGS:  Internal hemorrhoids, rare diverticula of the sigmoid.  Otherwise unremarkable examination.  PLAN:  See endoscopy note for further details. Dictated by:   Sabino Gasser, M.D. Attending Physician:  Sabino Gasser DD:  09/01/01 TD:  09/01/01 Job: [id number]

## 2011-05-09 NOTE — Discharge Summary (Signed)
NAME:  Heather Oneal, Heather Oneal                        ACCOUNT NO.:  [id number]   MEDICAL RECORD NO.:  [id number]                   PATIENT TYPE:  INP   LOCATION:  3743                                 FACILITY:  [facility]   PHYSICIAN:  Olga Millers, M.D. [facility]            DATE OF BIRTH:  [birth year]   DATE OF ADMISSION:  08/16/2004  DATE OF DISCHARGE:  08/19/2004                                 DISCHARGE SUMMARY   PROCEDURES:  1.  Cardiac catheterization.  2.  Coronary arteriogram.  3.  Left ventriculogram.   HOSPITAL COURSE:  Heather Oneal is a 70 year old female with no known history  of coronary artery disease.  She had sudden onset of substernal chest pain  associated with dyspnea and went to [facility].  A GI cocktail  did not help.  She was transported to the emergency room and admitted for  further evaluation and treatment.  Her enzymes were negative for MI and her  D-dimer was within normal limits at 0.33.  It was felt that cardiac  catheterization was indicated and this was performed on August 19, 2004.  The cardiac catheterization showed  normal __________, an EF of 65% with no  regional wall motion abnormalities and no AS or MR.  Dr. Samule Ohm evaluated  Heather Oneal and felt that she needed outpatient follow-up with Dr. Juleen China and  he also felt that a PA and lateral chest x-ray was indicated.  This was  performed and it showed no acute disease.  She is to follow up in the office  with a PA visit in one week and otherwise per primary care.   DISCHARGE MEDICATIONS:  1.  Protonix 40 mg daily.  2.  Zetia 10 mg daily.  3.  Tricor 145 mg daily.      Theodore Demark, P.A. [facility]                  Olga Millers, M.D. [facility]    RB/MEDQ  D:  08/19/2004  T:  08/20/2004  Job:  [id number]   cc:   Brooke Bonito, M.D.  [address]  [place]  [place] [address]  Fax: [phone]

## 2011-05-09 NOTE — H&P (Signed)
NAME:  Heather Oneal, Heather Oneal                        ACCOUNT NO.:  [id number]   MEDICAL RECORD NO.:  [id number]                   PATIENT TYPE:  EMS   LOCATION:  MAJO                                 FACILITY:  [facility]   PHYSICIAN:  Olga Millers, M.D. [facility]            DATE OF BIRTH:  01/12/1941   DATE OF ADMISSION:  08/16/2004  DATE OF DISCHARGE:                                HISTORY & PHYSICAL   CHIEF COMPLAINT:  Chest pain.   HISTORY OF PRESENT ILLNESS:  [name] is a 70 year old female with no  known history of coronary artery disease, who developed substernal chest  pain and dyspnea at 6 p.m. today.  She proceeded to the [facility] and was given a GI cocktail without relief. Her pain eventually  decreased on its own, however, she was transported to [facility]  for cardiac evaluation.   She admits to having several episodes of the substernal chest pain over the  past few months which had spontaneously resolved on its own.  She did admit  to traveling to [place] recently.  However the chest pain did occur in [place]  as well.   PAST MEDICAL HISTORY:  1. Significant for hyperlipidemia.  2. She has undergone a unilateral oophorectomy.  3. Her mother did have a history of heart disease.   ALLERGIES:  No known drug allergies.   MEDICATIONS:  1. Zetia 10 mg q.h.s.  2. She also takes a second cholesterol pill which she cannot remember the     name of at this point.   SOCIAL HISTORY:  She lives in [place] with her husband who apparently has  his own business and she sometimes helps in his business.  They do have  children.  She denies any tobacco, alcohol or illicit drug use and follows  are regular diet.   FAMILY HISTORY:  Her Mom is alive at age 41, she does have heart disease.   REVIEW OF SYSTEMS:  Positive for shortness of breath, chest pain,  diaphoresis, review of systems otherwise negative.   PHYSICAL EXAMINATION:  VITAL SIGNS:  Pulse 92,  respirations 20, blood  pressure 134/57, O2 saturation is 100% on 2 liters.  GENERAL:  She appears pale, weak and anxious.  HEENT:  Normal.  NECK:  No carotid bruits, no jugular venous distension or thyromegaly.  HEART:  Regular rate and rhythm, no murmur, rub or ectopy.  ABDOMEN:  Good bowel sounds, nontender, nondistended, no masses, no bruits.  LOWER EXTREMITIES:  No clubbing, cyanosis or edema.  Homan's sign negative.  NEURO:  Cranial nerves II-XII grossly intact.   Chest x-ray is pending at this time.  Labs are pending.  EKG reveals normal  sinus rhythm, rate 84, with no acute ST, T wave changes.   ASSESSMENT AND PLAN:  1. ACS.  2. Hyperlipidemia, treated.   At this point we will continue cardiac isoenzymes, check a D-dimer, place  her on aspirin, heparin, nitroglycerin and Lopressor.  Cardiac  catheterization is planned for Monday.  The risk and benefits were discussed  with the patient and her husband and she agrees to proceed.  The patient was  seen and examined by Dr. Olga Millers.      Guy Franco, P.A. [facility]                      Olga Millers, M.D. [facility]    LB/MEDQ  D:  08/16/2004  T:  08/16/2004  Job:  [id number]   cc:   Merla Riches, M.D.  [facility]

## 2011-07-17 ENCOUNTER — Ambulatory Visit (INDEPENDENT_AMBULATORY_CARE_PROVIDER_SITE_OTHER): Payer: No Typology Code available for payment source | Admitting: Family Medicine

## 2011-07-17 ENCOUNTER — Encounter: Payer: Self-pay | Admitting: Family Medicine

## 2011-07-17 VITALS — BP 140/80 | Temp 98.6°F | Wt 235.0 lb

## 2011-07-17 DIAGNOSIS — M25569 Pain in unspecified knee: Secondary | ICD-10-CM

## 2011-07-17 DIAGNOSIS — M25562 Pain in left knee: Secondary | ICD-10-CM

## 2011-07-17 MED ORDER — MELOXICAM 15 MG PO TABS: 15.0000 mg | ORAL_TABLET | Freq: Every day | ORAL | Status: AC

## 2011-07-17 NOTE — Progress Notes (Signed)
  Subjective:    Patient ID: Heather Oneal, female    DOB: [birth year], 70 y.o.   MRN: [id number]  HPI Persistent but intermittent left knee pain. Fall back in February. Initially had more posterior knee pain now the past couple of weeks more anterior and somewhat medial and inferior to the joint space. She has not noted any ecchymosis, erythema, or a wart. Good range of motion. No locking or giving way. Aleve with mild relief. No evidence for effusion. Pain is worse with ambulation and not at rest.   Review of Systems  Constitutional: Negative for fever, chills, appetite change and unexpected weight change.  Cardiovascular: Negative for leg swelling.  Skin: Negative for rash.  Hematological: Negative for adenopathy.       Objective:   Physical Exam  Constitutional: She appears well-developed and well-nourished.  Cardiovascular: Normal rate and regular rhythm.   Pulmonary/Chest: Effort normal and breath sounds normal. No respiratory distress. She has no wheezes. She has no rales.  Musculoskeletal:       Left knee reveals good range of motion. No effusion. No warmth. No erythema. No ecchymosis. She has mild tenderness pes anserine bursa. No patellar tenderness. Ligament testing is normal. No popliteal swelling          Assessment & Plan:  Patient has intermittent left knee pain off and on since fall several months ago. No prior imaging. Current pain suggest possible bursitis. Obtain knee x-rays. Meloxicam 15 mg daily and ice 2-3 times daily

## 2011-07-17 NOTE — Patient Instructions (Signed)
Continue to ice knee 2-3 times daily for 20-30 minutes per episode.

## 2011-07-18 ENCOUNTER — Ambulatory Visit (INDEPENDENT_AMBULATORY_CARE_PROVIDER_SITE_OTHER)
Admission: RE | Admit: 2011-07-18 | Discharge: 2011-07-18 | Disposition: A | Discharge status: Home / Self Care | Payer: No Typology Code available for payment source | Source: Ambulatory Visit | Attending: Family Medicine | Admitting: Family Medicine

## 2011-07-18 DIAGNOSIS — M25562 Pain in left knee: Secondary | ICD-10-CM

## 2011-07-18 DIAGNOSIS — M25569 Pain in unspecified knee: Secondary | ICD-10-CM

## 2011-07-21 NOTE — Progress Notes (Signed)
Quick Note:  Pt informed, knee is actually feeling a bit better pt reports ______

## 2011-07-24 ENCOUNTER — Encounter: Payer: Self-pay | Admitting: Family Medicine

## 2011-07-24 ENCOUNTER — Ambulatory Visit (INDEPENDENT_AMBULATORY_CARE_PROVIDER_SITE_OTHER): Payer: No Typology Code available for payment source | Admitting: Family Medicine

## 2011-07-24 VITALS — BP 102/72 | Temp 98.4°F

## 2011-07-24 DIAGNOSIS — IMO0002 Reserved for concepts with insufficient information to code with codable children: Secondary | ICD-10-CM

## 2011-07-24 DIAGNOSIS — M7050 Other bursitis of knee, unspecified knee: Secondary | ICD-10-CM

## 2011-07-24 NOTE — Patient Instructions (Signed)
Continue with icing knee 2-3 times daily for 20-30 minutes per episode.

## 2011-07-24 NOTE — Progress Notes (Signed)
  Subjective:    Patient ID: Heather Oneal, female    DOB: 24-Jan-1941, 70 y.o.   MRN: [id number]  HPI Persistent L knee pain.  Pain is actually more pes anserine bursa region.  Recent X-ray moderate arthritis without acute bony abnormality.  Pain somewhat off and on.  No relief with icing.  Some pain with ambulation.  No weakness.  Ambulating OK.   Review of Systems  Constitutional: Negative for fever and chills.  Respiratory: Negative for cough and shortness of breath.   Cardiovascular: Negative for chest pain and leg swelling.  Skin: Negative for rash.       Objective:   Physical Exam  Constitutional: She appears well-developed and well-nourished.  Cardiovascular: Normal rate and regular rhythm.   Pulmonary/Chest: Effort normal and breath sounds normal. No respiratory distress. She has no wheezes. She has no rales.  Musculoskeletal:       L knee no effusion.  NO warmth or erythema.  Full ROM.  No joint line tenderness.  Tender over pes anserine bursa region.            Assessment & Plan:  Pes anserine bursitis.  Discussed risks and benefits of steroid injection and pt consented.  Skin prepped with betadine and using 25 gauge one inch needle injected 20 mg depomedrol and 1 cc plain xylocaine into bursa region and pt tolerated well.  Continue with icing.

## 2011-08-06 ENCOUNTER — Telehealth: Payer: Self-pay | Admitting: Family Medicine

## 2011-08-06 DIAGNOSIS — M25562 Pain in left knee: Secondary | ICD-10-CM

## 2011-08-06 NOTE — Telephone Encounter (Signed)
Pt came in over a wk ago to get a shot of prednisone in pts knee for pain. Pt still having pain in knee. Pt req a call back from Dr Caryl Never.

## 2011-08-06 NOTE — Telephone Encounter (Signed)
I recommend refer to orthopedist at this time.  If no preference, set up with [facility]opedic group.

## 2011-08-06 NOTE — Telephone Encounter (Signed)
Please call pt at [phone]

## 2011-08-29 ENCOUNTER — Ambulatory Visit (INDEPENDENT_AMBULATORY_CARE_PROVIDER_SITE_OTHER): Payer: No Typology Code available for payment source | Admitting: Family Medicine

## 2011-08-29 ENCOUNTER — Encounter: Payer: Self-pay | Admitting: Family Medicine

## 2011-08-29 VITALS — BP 110/72 | Temp 98.1°F | Wt 236.0 lb

## 2011-08-29 DIAGNOSIS — J310 Chronic rhinitis: Secondary | ICD-10-CM

## 2011-08-29 MED ORDER — AZELASTINE HCL 0.1 % NA SOLN: 2.0000 | Freq: Two times a day (BID) | NASAL | Status: DC

## 2011-08-29 NOTE — Patient Instructions (Signed)
May continue with oral anti histamine in addition to Astelin.

## 2011-08-29 NOTE — Progress Notes (Signed)
  Subjective:    Patient ID: Heather Oneal, female    DOB: 1941-06-01, 70 y.o.   MRN: [id number]  HPI 2 day history of sinus and nasal congestion. Patient has some postnasal drainage. Occasional sneezing. No eye symptoms. No purulent drainage. Denies fever or chills. No sore throat. Has tried antihistamine orally with minimal improvement. Denies any dizziness or headache. Nasal drainage is clear   Review of Systems  Constitutional: Negative for fever and chills.  HENT: Positive for congestion, rhinorrhea, sneezing and postnasal drip. Negative for ear pain and sore throat.   Respiratory: Negative for cough and shortness of breath.   Cardiovascular: Negative for chest pain.       Objective:   Physical Exam  Constitutional: She appears well-developed and well-nourished.  HENT:  Right Ear: External ear normal.  Left Ear: External ear normal.  Nose: Nose normal.  Mouth/Throat: Oropharynx is clear and moist.  Neck: Neck supple.  Cardiovascular: Normal rate and regular rhythm.   Pulmonary/Chest: Effort normal. No respiratory distress. She has no wheezes. She has no rales.  Lymphadenopathy:    She has no cervical adenopathy.          Assessment & Plan:  Rhinitis. Suspect allergic. Add Astelin nasal spray one spray per nostril twice daily

## 2013-11-23 ENCOUNTER — Encounter: Payer: Self-pay | Admitting: Family Medicine

## 2013-11-23 ENCOUNTER — Ambulatory Visit (INDEPENDENT_AMBULATORY_CARE_PROVIDER_SITE_OTHER): Payer: Medicare Other | Admitting: Family Medicine

## 2013-11-23 VITALS — BP 128/80 | HR 75 | Temp 97.4°F | Ht 66.0 in | Wt 238.0 lb

## 2013-11-23 DIAGNOSIS — J309 Allergic rhinitis, unspecified: Secondary | ICD-10-CM

## 2013-11-23 DIAGNOSIS — Z23 Encounter for immunization: Secondary | ICD-10-CM

## 2013-11-23 DIAGNOSIS — R5381 Other malaise: Secondary | ICD-10-CM

## 2013-11-23 DIAGNOSIS — E669 Obesity, unspecified: Secondary | ICD-10-CM

## 2013-11-23 DIAGNOSIS — R5383 Other fatigue: Secondary | ICD-10-CM

## 2013-11-23 DIAGNOSIS — E785 Hyperlipidemia, unspecified: Secondary | ICD-10-CM

## 2013-11-23 DIAGNOSIS — Z0000 Encounter for general adult medical examination without abnormal findings: Secondary | ICD-10-CM

## 2013-11-23 LAB — LIPID PANEL
Cholesterol: 278 mg/dL — ABNORMAL HIGH (ref 0–200)
HDL: 51 mg/dL (ref >39.00)
Triglycerides: 155 mg/dL — ABNORMAL HIGH (ref 0.0–149.0)

## 2013-11-23 LAB — CBC WITH DIFFERENTIAL/PLATELET
Basophils Absolute: 0 10*3/uL (ref 0.0–0.1)
Eosinophils Absolute: 0.2 10*3/uL (ref 0.0–0.7)
Hemoglobin: 12.5 g/dL (ref 12.0–15.0)
Lymphocytes Relative: 30.1 % (ref 12.0–46.0)
MCHC: 33.7 g/dL (ref 30.0–36.0)
Monocytes Relative: 9.7 % (ref 3.0–12.0)
Neutro Abs: 4 10*3/uL (ref 1.4–7.7)
Neutrophils Relative %: 57.1 % (ref 43.0–77.0)
Platelets: 344 10*3/uL (ref 150.0–400.0)
RDW: 15.3 % — ABNORMAL HIGH (ref 11.5–14.6)

## 2013-11-23 LAB — BASIC METABOLIC PANEL
BUN: 15 mg/dL (ref 6–23)
Calcium: 9.2 mg/dL (ref 8.4–10.5)
Creatinine, Ser: 0.7 mg/dL (ref 0.4–1.2)
GFR: 81.96 mL/min (ref >60.00)
Glucose, Bld: 92 mg/dL (ref 70–99)
Potassium: 4.3 mEq/L (ref 3.5–5.1)

## 2013-11-23 LAB — TSH: TSH: 0.35 u[IU]/mL (ref 0.35–5.50)

## 2013-11-23 LAB — HEPATIC FUNCTION PANEL
ALT: 26 U/L (ref 0–35)
Alkaline Phosphatase: 60 U/L (ref 39–117)
Bilirubin, Direct: 0 mg/dL (ref 0.0–0.3)
Total Protein: 7.3 g/dL (ref 6.0–8.3)

## 2013-11-23 NOTE — Patient Instructions (Signed)
Try to lose some weight. Check on coverage for shingles vaccine. Set regular mammograms yearly Recommend daily calcium 1200 mg and vitamin D 1000 international units daily Will call you regarding lab work

## 2013-11-23 NOTE — Progress Notes (Signed)
Subjective:    Patient ID: Heather Oneal, female    DOB: 03/29/1941, 72 y.o.   MRN: [id number]  HPI The patient is here for Medicare wellness exam and medical followup She has been followed by gynecologist and plans to set up a followup with them soon. Last mammogram estimated 2 years ago.  She reports last colonoscopy 2005 normal. No history of smoking. She has generally not done flu vaccine but will consider. No history of shingles vaccine. Last tetanus unknown. She does not take any regular calcium or vitamin D. She's had previous bone density scans have been normal.  She has hyperlipidemia but previous intolerance to statin. She is requesting repeat lipids. She's had some generalized fatigue issues.  Intermittent sciatica symptoms and is seeing chiropractor and that seems to be helping.  Past Medical History  Diagnosis Date  . Depression   . Allergy   . History of IBS   . Hyperlipidemia   . HYPERLIPIDEMIA 01/28/2010  . DEPRESSION 01/28/2010   Past Surgical History  Procedure Laterality Date  . Ovarian cyst surgery  1973    ruptured ovarian cyst  . Appendectomy  1973  . Cholecystectomy  2008  . Tonsillectomy  1948    reports that she has never smoked. She does not have any smokeless tobacco history on file. Her alcohol and drug histories are not on file. family history includes Cancer (age of onset: 77) in her sister; Crohn's disease in her brother; Diabetes in her maternal grandmother; Heart disease in her mother and paternal grandfather; Hypertension in her mother. No Known Allergies  1.  Risk factors based on Past Medical , Social, and Family history reviewed and as indicated above 2.  Limitations in physical activities no regular physical activity. Low risk for fall 3.  Depression/mood has history of depression currently stable off medication 4.  Hearing no deficits 5.  ADLs independent in all 6.  Cognitive function (orientation to time and place, language, writing,  speech,memory) no memory deficits. Language and judgment intact 7.  Home Safety no issues 8.  Height, weight, and visual acuity. All stable. She gets regular eye exams 9.  Counseling counseled on importance of losing weight and taking regular calcium and vitamin D and weightbearing exercise 10. Recommendation of preventive services. Needs followup mammogram. Flu vaccine given. Check on coverage for shingles vaccine. Repeat colonoscopy Next year. 11. Labs based on risk factors lipid, TSH, BMP. 12. Care Plan as above    Review of Systems  Constitutional: Positive for fatigue. Negative for fever, activity change, appetite change and unexpected weight change.  HENT: Negative for ear pain, hearing loss, sore throat and trouble swallowing.   Eyes: Negative for visual disturbance.  Respiratory: Negative for cough and shortness of breath.   Cardiovascular: Negative for chest pain and palpitations.  Gastrointestinal: Negative for abdominal pain, diarrhea, constipation and blood in stool.  Genitourinary: Negative for dysuria and hematuria.  Musculoskeletal: Positive for arthralgias and back pain. Negative for myalgias.  Skin: Negative for rash.  Neurological: Negative for dizziness, syncope and headaches.  Hematological: Negative for adenopathy.  Psychiatric/Behavioral: Negative for confusion and dysphoric mood.       Objective:   Physical Exam  Constitutional: She is oriented to person, place, and time. She appears well-developed and well-nourished.  HENT:  Head: Normocephalic and atraumatic.  Eyes: EOM are normal. Pupils are equal, round, and reactive to light.  Neck: Normal range of motion. Neck supple. No thyromegaly present.  Cardiovascular: Normal rate, regular  rhythm and normal heart sounds.   No murmur heard. Pulmonary/Chest: Breath sounds normal. No respiratory distress. She has no wheezes. She has no rales.  Abdominal: Soft. Bowel sounds are normal. She exhibits no distension and  no mass. There is no tenderness. There is no rebound and no guarding.  Genitourinary:  Per GYN. We offered breast exam today and patient defers and will see gynecologist for this  Musculoskeletal: Normal range of motion. She exhibits no edema.  Lymphadenopathy:    She has no cervical adenopathy.  Neurological: She is alert and oriented to person, place, and time. She displays normal reflexes. No cranial nerve deficit.  Skin: No rash noted.  Psychiatric: She has a normal mood and affect. Her behavior is normal. Judgment and thought content normal.          Assessment & Plan:  #1 physical exam. We discussed importance of weight loss. Flu vaccine given. Check on coverage for shingles vaccine. Patient needs tetanus booster. Schedule mammogram. Repeat colonoscopy done by next year. Discussed adequate calcium and vitamin D supplementation. #2 seasonal allergies. Try over-the-counter antihistamine and be in touch if not adequate control #3 dyslipidemia. Patient request repeat lipids. She is undecided whether she would try additional statin if this is still elevated #4 fatigue. Check TSH and CBC along with basic metabolic panel

## 2013-11-23 NOTE — Progress Notes (Signed)
Pre visit review using our clinic review tool, if applicable. No additional management support is needed unless otherwise documented below in the visit note. 

## 2013-12-06 ENCOUNTER — Telehealth: Payer: Self-pay | Admitting: *Deleted

## 2013-12-06 DIAGNOSIS — E785 Hyperlipidemia, unspecified: Secondary | ICD-10-CM

## 2013-12-06 MED ORDER — ROSUVASTATIN CALCIUM 10 MG PO TABS: 10.0000 mg | ORAL_TABLET | Freq: Every day | ORAL | Status: DC

## 2013-12-06 NOTE — Telephone Encounter (Signed)
Patient returned phone call. She was informed of Dr. Lucie Leather recommendations. She agreed to begin Crestor. She stated she would call back in 6-8 weeks to schedule a follow up lab appt. Crestor sent to [employer] and future labs entered into [employer]. JG//CMA

## 2013-12-09 ENCOUNTER — Telehealth: Payer: Self-pay | Admitting: Family Medicine

## 2013-12-09 NOTE — Telephone Encounter (Signed)
She had some tests done and has hi cholesterol an was prescribed Crestor and started 12/08/13.  She took this with Advil for her back and her throat seemed to itch and her ears started itching yesterdasy, 12/08/13.   Lasted about 15-30 mins and then started clearing.   She has not taken Crestor or Advil again today.  Triaged Allergic  Reaction, Severe and no symptoms today.  Instructed to hold Crestor 12/10/13 and 12/11/13 and recall the office 12/12/13.  She can also check for interactions with her RPh.  Call back instructions given.

## 2013-12-14 NOTE — Telephone Encounter (Signed)
Would hold Crestor for now.

## 2013-12-14 NOTE — Telephone Encounter (Signed)
Pt will be waiting for callback

## 2013-12-14 NOTE — Telephone Encounter (Signed)
Pt informed

## 2013-12-30 LAB — HM MAMMOGRAPHY: HM Mammogram: NEGATIVE

## 2014-01-09 ENCOUNTER — Encounter: Payer: Self-pay | Admitting: Family Medicine

## 2014-01-18 ENCOUNTER — Ambulatory Visit (INDEPENDENT_AMBULATORY_CARE_PROVIDER_SITE_OTHER): Payer: Medicare Other | Admitting: Family Medicine

## 2014-01-18 ENCOUNTER — Encounter: Payer: Self-pay | Admitting: Family Medicine

## 2014-01-18 VITALS — BP 120/68 | HR 73 | Temp 97.7°F | Wt 237.0 lb

## 2014-01-18 DIAGNOSIS — E785 Hyperlipidemia, unspecified: Secondary | ICD-10-CM

## 2014-01-18 NOTE — Progress Notes (Signed)
   Subjective:    Patient ID: Heather Oneal, female    DOB: October 19, 1941, 73 y.o.   MRN: [id number]  HPI Patient here to discuss dyslipidemia issues. She had recent physical with total cholesterol 278 and LDL 211. We placed her Crestor but after taking this for 2 days developed itching of the ears and burning sensation her throat. She denied any angioedema-type symptoms or any rash. She stopped Crestor and symptoms went away. She had previous intolerance of Lipitor body aches. She was and other medical clinical started recently this for days ago on Medifast diet.  She prefers not to try any other statins or cholesterol-lowering medications this time. She does not do any regular exercise.  Past Medical History  Diagnosis Date  . Depression   . Allergy   . History of IBS   . Hyperlipidemia   . HYPERLIPIDEMIA 01/28/2010  . DEPRESSION 01/28/2010   Past Surgical History  Procedure Laterality Date  . Ovarian cyst surgery  1973    ruptured ovarian cyst  . Appendectomy  1973  . Cholecystectomy  2008  . Tonsillectomy  1948    reports that she has never smoked. She does not have any smokeless tobacco history on file. Her alcohol and drug histories are not on file. family history includes Cancer (age of onset: 56) in her sister; Crohn's disease in her brother; Diabetes in her maternal grandmother; Heart disease in her mother and paternal grandfather; Hypertension in her mother. Allergies  Allergen Reactions  . Crestor [Rosuvastatin]     " Ears itching and throat burning"          Review of Systems  Constitutional: Negative for fatigue.  Eyes: Negative for visual disturbance.  Respiratory: Negative for cough, chest tightness, shortness of breath and wheezing.   Cardiovascular: Negative for chest pain, palpitations and leg swelling.  Neurological: Negative for dizziness, seizures, syncope, weakness, light-headedness and headaches.       Objective:   Physical Exam  Constitutional:  She appears well-developed and well-nourished. No distress.  Cardiovascular: Normal rate and regular rhythm.  Exam reveals no gallop.   Pulmonary/Chest: Effort normal and breath sounds normal. No respiratory distress. She has no wheezes. She has no rales.          Assessment & Plan:  Dyslipidemia. Discussed options. She is resistant to other statin medication at this time. She will continue weight loss program above and repeat fasting lipids in 4 months. If still elevated at that point consider either Zetia or WelChol.

## 2014-01-18 NOTE — Patient Instructions (Signed)
Fat and Cholesterol Control Diet Fat and cholesterol levels in your blood and organs are influenced by your diet. High levels of fat and cholesterol may lead to diseases of the heart, small and large blood vessels, gallbladder, liver, and pancreas. CONTROLLING FAT AND CHOLESTEROL WITH DIET Although exercise and lifestyle factors are important, your diet is key. That is because certain foods are known to raise cholesterol and others to lower it. The goal is to balance foods for their effect on cholesterol and more importantly, to replace saturated and trans fat with other types of fat, such as monounsaturated fat, polyunsaturated fat, and omega-3 fatty acids. On average, a person should consume no more than 15 to 17 g of saturated fat daily. Saturated and trans fats are considered "bad" fats, and they will raise LDL cholesterol. Saturated fats are primarily found in animal products such as meats, butter, and cream. However, that does not mean you need to give up all your favorite foods. Today, there are good tasting, low-fat, low-cholesterol substitutes for most of the things you like to eat. Choose low-fat or nonfat alternatives. Choose round or loin cuts of red meat. These types of cuts are lowest in fat and cholesterol. Chicken (without the skin), fish, veal, and ground turkey breast are great choices. Eliminate fatty meats, such as hot dogs and salami. Even shellfish have little or no saturated fat. Have a 3 oz (85 g) portion when you eat lean meat, poultry, or fish. Trans fats are also called "partially hydrogenated oils." They are oils that have been scientifically manipulated so that they are solid at room temperature resulting in a longer shelf life and improved taste and texture of foods in which they are added. Trans fats are found in stick margarine, some tub margarines, cookies, crackers, and baked goods.  When baking and cooking, oils are a great substitute for butter. The monounsaturated oils are  especially beneficial since it is believed they lower LDL and raise HDL. The oils you should avoid entirely are saturated tropical oils, such as coconut and palm.  Remember to eat a lot from food groups that are naturally free of saturated and trans fat, including fish, fruit, vegetables, beans, grains (barley, rice, couscous, bulgur wheat), and pasta (without cream sauces).  IDENTIFYING FOODS THAT LOWER FAT AND CHOLESTEROL  Soluble fiber may lower your cholesterol. This type of fiber is found in fruits such as apples, vegetables such as broccoli, potatoes, and carrots, legumes such as beans, peas, and lentils, and grains such as barley. Foods fortified with plant sterols (phytosterol) may also lower cholesterol. You should eat at least 2 g per day of these foods for a cholesterol lowering effect.  Read package labels to identify low-saturated fats, trans fat free, and low-fat foods at the supermarket. Select cheeses that have only 2 to 3 g saturated fat per ounce. Use a heart-healthy tub margarine that is free of trans fats or partially hydrogenated oil. When buying baked goods (cookies, crackers), avoid partially hydrogenated oils. Breads and muffins should be made from whole grains (whole-wheat or whole oat flour, instead of "flour" or "enriched flour"). Buy non-creamy canned soups with reduced salt and no added fats.  FOOD PREPARATION TECHNIQUES  Never deep-fry. If you must fry, either stir-fry, which uses very little fat, or use non-stick cooking sprays. When possible, broil, bake, or roast meats, and steam vegetables. Instead of putting butter or margarine on vegetables, use lemon and herbs, applesauce, and cinnamon (for squash and sweet potatoes). Use nonfat   yogurt, salsa, and low-fat dressings for salads.  LOW-SATURATED FAT / LOW-FAT FOOD SUBSTITUTES Meats / Saturated Fat (g) Avoid: Steak, marbled (3 oz/85 g) / 11 g Choose: Steak, lean (3 oz/85 g) / 4 g Avoid: Hamburger (3 oz/85 g) / 7 g Choose:  Hamburger, lean (3 oz/85 g) / 5 g Avoid: Ham (3 oz/85 g) / 6 g Choose: Ham, lean cut (3 oz/85 g) / 2.4 g Avoid: Chicken, with skin, dark meat (3 oz/85 g) / 4 g Choose: Chicken, skin removed, dark meat (3 oz/85 g) / 2 g Avoid: Chicken, with skin, light meat (3 oz/85 g) / 2.5 g Choose: Chicken, skin removed, light meat (3 oz/85 g) / 1 g Dairy / Saturated Fat (g) Avoid: Whole milk (1 cup) / 5 g Choose: Low-fat milk, 2% (1 cup) / 3 g Choose: Low-fat milk, 1% (1 cup) / 1.5 g Choose: Skim milk (1 cup) / 0.3 g Avoid: Hard cheese (1 oz/28 g) / 6 g Choose: Skim milk cheese (1 oz/28 g) / 2 to 3 g Avoid: Cottage cheese, 4% fat (1 cup) / 6.5 g Choose: Low-fat cottage cheese, 1% fat (1 cup) / 1.5 g Avoid: Ice cream (1 cup) / 9 g Choose: Sherbet (1 cup) / 2.5 g Choose: Nonfat frozen yogurt (1 cup) / 0.3 g Choose: Frozen fruit bar / trace Avoid: Whipped cream (1 tbs) / 3.5 g Choose: Nondairy whipped topping (1 tbs) / 1 g Condiments / Saturated Fat (g) Avoid: Mayonnaise (1 tbs) / 2 g Choose: Low-fat mayonnaise (1 tbs) / 1 g Avoid: Butter (1 tbs) / 7 g Choose: Extra light margarine (1 tbs) / 1 g Avoid: Coconut oil (1 tbs) / 11.8 g Choose: Olive oil (1 tbs) / 1.8 g Choose: Corn oil (1 tbs) / 1.7 g Choose: Safflower oil (1 tbs) / 1.2 g Choose: Sunflower oil (1 tbs) / 1.4 g Choose: Soybean oil (1 tbs) / 2.4 g Choose: Canola oil (1 tbs) / 1 g Document Released: 12/08/2005 Document Revised: 04/04/2013 Document Reviewed: 05/29/2011 [employer] Patient Information 2014 [place], [place]. Hypercholesterolemia High Blood Cholesterol Cholesterol is a white, waxy, fat-like protein needed by your body in small amounts. The liver makes all the cholesterol you need. It is carried from the liver by the blood through the blood vessels. Deposits (plaque) may build up on blood vessel walls. This makes the arteries narrower and stiffer. Plaque increases the risk for heart attack and stroke. You cannot feel your  cholesterol level even if it is very high. The only way to know is by a blood test to check your lipid (fats) levels. Once you know your cholesterol levels, you should keep a record of the test results. Work with your caregiver to to keep your levels in the desired range. WHAT THE RESULTS MEAN:  Total cholesterol is a rough measure of all the cholesterol in your blood.  LDL is the so-called bad cholesterol. This is the type that deposits cholesterol in the walls of the arteries. You want this level to be low.  HDL is the good cholesterol because it cleans the arteries and carries the LDL away. You want this level to be high.  Triglycerides are fat that the body can either burn for energy or store. High levels are closely linked to heart disease. DESIRED LEVELS:  Total cholesterol below 200.  LDL below 100 for people at risk, below 70 for very high risk.  HDL above 50 is good, above 60 is best.  Triglycerides  below 150. HOW TO LOWER YOUR CHOLESTEROL:  Diet.  Choose fish or white meat chicken and [place], roasted or baked. Limit fatty cuts of red meat, fried foods, and processed meats, such as sausage and lunch meat.  Eat lots of fresh fruits and vegetables. Choose whole grains, beans, pasta, potatoes and cereals.  Use only small amounts of olive, corn or canola oils. Avoid butter, mayonnaise, shortening or palm kernel oils. Avoid foods with trans-fats.  Use skim/nonfat milk and low-fat/nonfat yogurt and cheeses. Avoid whole milk, cream, ice cream, egg yolks and cheeses. Healthy desserts include angel food cake, gingersnaps, animal crackers, hard candy, popsicles, and low-fat/nonfat frozen yogurt. Avoid pastries, cakes, pies and cookies.  Exercise.  A regular program helps decrease LDL and raises HDL.  Helps with weight control.  Do things that increase your activity level like gardening, walking, or taking the stairs.  Medication.  May be prescribed by your caregiver to help  lowering cholesterol and the risk for heart disease.  You may need medicine even if your levels are normal if you have several risk factors. HOME CARE INSTRUCTIONS   Follow your diet and exercise programs as suggested by your caregiver.  Take medications as directed.  Have blood work done when your caregiver feels it is necessary. MAKE SURE YOU:   Understand these instructions.  Will watch your condition.  Will get help right away if you are not doing well or get worse. Document Released: 12/08/2005 Document Revised: 03/01/2012 Document Reviewed: 05/26/2007 [facility] Patient Information 2014 [place].

## 2014-01-18 NOTE — Progress Notes (Signed)
Pre visit review using our clinic review tool, if applicable. No additional management support is needed unless otherwise documented below in the visit note. 

## 2014-03-03 ENCOUNTER — Telehealth: Payer: Self-pay | Admitting: Family Medicine

## 2014-03-03 NOTE — Telephone Encounter (Signed)
Patient Information:  Caller Name: Lisaanne  Phone: [phone]  Patient: Heather Oneal, Heather Oneal  Gender: Female  DOB: 25-Jun-1941  Age: 73 Years  PCP: Carolann Littler (Family Practice)  Office Follow Up:  Does the office need to follow up with this patient?: No  Instructions For The Office: N/A  RN Note:  per disp pt states she will call back in the AM if she is not improved after trying OTC medications tonight.  Symptoms  Reason For Call & Symptoms: Pt reports she  sore throat, congestion, nasal discharge clear in color, watery eyes.  Reviewed Health History In EMR: Yes  Reviewed Medications In EMR: Yes  Reviewed Allergies In EMR: Yes  Reviewed Surgeries / Procedures: Yes  Date of Onset of Symptoms: 03/02/2014  Guideline(s) Used:  Colds  Hay Fever - Nasal Allergies  Disposition Per Guideline:   See Today or Tomorrow in Office  Reason For Disposition Reached:   Moderate-Severe nasal allergy symptoms (i.e., interfere with sleep, school, or work) and taking antihistamines > 2 days  Advice Given:  Wash off Pollen Daily:  Remove pollen from the body with hair washing and a shower, especially before bedtime.  Avoiding Pollen:  Stay indoors on windy days  Keep windows closed in home, at least in bedroom; use air conditioner  For a Stuffy Nose - Use Nasal Washes:  Introduction: Saline (salt water) nasal irrigation (nasal washes) is an effective and simple home remedy for treating stuffy nose and sinus congestion. The nose can be irrigated by pouring, spraying, or squirting salt water into the nose and then letting it run back out.  How to Make Saline [facility] Water) Nasal Wash :  You can make your own saline nasal wash.  Add 1/2 tsp of table salt to 1 cup (8 oz; 240 ml) of warm water.  You should use sterile, distilled, or previously boiled water for nasal irrigation.  Antihistamine Medications for Hay Fever:  Antihistamines help reduce sneezing, itching, and runny nose.  For Eye  Allergies:   For eye symptoms, wash pollen off the face and eyelids. Then apply cold wet compresses. Oral antihistamines will usually bring all eye symptoms under control.  Call Back If:  Symptoms are not controlled in 2 days with continuous antihistamines  You become worse  Patient Refused Recommendation:  Patient Refused Appt, Patient Requests Appt At Later Date  Pt will try OTC meds first and then call back for APPT.

## 2014-03-03 NOTE — Telephone Encounter (Signed)
Pt informed

## 2014-03-03 NOTE — Telephone Encounter (Signed)
Agree with advice per call a nurse and she could be seen Saturday clinic, if necessary.

## 2014-05-18 ENCOUNTER — Other Ambulatory Visit (INDEPENDENT_AMBULATORY_CARE_PROVIDER_SITE_OTHER): Payer: Medicare Other

## 2014-05-18 DIAGNOSIS — E785 Hyperlipidemia, unspecified: Secondary | ICD-10-CM

## 2014-05-18 LAB — HEPATIC FUNCTION PANEL
ALT: 18 U/L (ref 0–35)
AST: 20 U/L (ref 0–37)
Albumin: 3.8 g/dL (ref 3.5–5.2)
Alkaline Phosphatase: 64 U/L (ref 39–117)
BILIRUBIN TOTAL: 1.1 mg/dL (ref 0.2–1.2)
Bilirubin, Direct: 0.1 mg/dL (ref 0.0–0.3)
TOTAL PROTEIN: 6.9 g/dL (ref 6.0–8.3)

## 2014-05-18 LAB — LIPID PANEL
CHOLESTEROL: 258 mg/dL — AB (ref 0–200)
HDL: 50 mg/dL (ref >39.00)
LDL Cholesterol: 181 mg/dL — ABNORMAL HIGH (ref 0–99)
Total CHOL/HDL Ratio: 5
Triglycerides: 135 mg/dL (ref 0.0–149.0)
VLDL: 27 mg/dL (ref 0.0–40.0)

## 2014-05-19 ENCOUNTER — Telehealth: Payer: Self-pay | Admitting: Family Medicine

## 2014-05-19 ENCOUNTER — Encounter: Payer: Self-pay | Admitting: Family Medicine

## 2014-05-19 ENCOUNTER — Ambulatory Visit: Payer: Self-pay | Admitting: Family Medicine

## 2014-05-19 ENCOUNTER — Other Ambulatory Visit: Payer: Self-pay

## 2014-05-19 ENCOUNTER — Ambulatory Visit (INDEPENDENT_AMBULATORY_CARE_PROVIDER_SITE_OTHER): Payer: Medicare Other | Admitting: Family Medicine

## 2014-05-19 ENCOUNTER — Other Ambulatory Visit: Payer: Self-pay | Admitting: Family Medicine

## 2014-05-19 VITALS — BP 120/80 | HR 70 | Temp 97.7°F | Wt 235.0 lb

## 2014-05-19 DIAGNOSIS — E785 Hyperlipidemia, unspecified: Secondary | ICD-10-CM

## 2014-05-19 DIAGNOSIS — K5900 Constipation, unspecified: Secondary | ICD-10-CM

## 2014-05-19 DIAGNOSIS — Z1211 Encounter for screening for malignant neoplasm of colon: Secondary | ICD-10-CM

## 2014-05-19 DIAGNOSIS — R1084 Generalized abdominal pain: Secondary | ICD-10-CM

## 2014-05-19 DIAGNOSIS — R109 Unspecified abdominal pain: Secondary | ICD-10-CM

## 2014-05-19 MED ORDER — EZETIMIBE 10 MG PO TABS: 10.0000 mg | ORAL_TABLET | Freq: Every day | ORAL | Status: DC

## 2014-05-19 NOTE — Progress Notes (Signed)
Pre visit review using our clinic review tool, if applicable. No additional management support is needed unless otherwise documented below in the visit note. 

## 2014-05-19 NOTE — Telephone Encounter (Signed)
Patient Information:  Caller Name: [place]a  Phone: [phone]  Patient: Sanskriti, Greenlaw  Gender: Female  DOB: Apr 05, 1941  Age: 73 Years  PCP: Carolann Littler (Family Practice)  Office Follow Up:  Does the office need to follow up with this patient?: No  Instructions For The Office: N/A  RN Note:  Patient states she developed generalized lower and mid abdominal discomfort, onset 0600 05/19/14. Patient states abdominal discomfort is accompanied by bloating and "belching." Denies nausea, vomiting or diarrhea. Last bowel movement was at 1030 05/19/14, describes as soft, long thin bowel movement. Denies urinary symptoms. Denies flank pain. Patient states abdominal discomfort is gradually improving but not resolved. States discomfort is currently at a "1" on a 1-10 scale. Care advice given per guidelines. Call back parameters reviewed. Patient verbalizes understanding. Patient requests first available office appt.   Symptoms  Reason For Call & Symptoms: Abdominal pain  Reviewed Health History In EMR: Yes  Reviewed Medications In EMR: Yes  Reviewed Allergies In EMR: Yes  Reviewed Surgeries / Procedures: Yes  Date of Onset of Symptoms: 05/19/2014  Guideline(s) Used:  Abdominal Pain - Female  Disposition Per Guideline:   Go to ED Now (or to Office with PCP Approval)  Reason For Disposition Reached:   Constant abdominal pain lasting > 2 hours  Advice Given:  Fluids:  Sip clear fluids only (e.g., water, flat soft drinks or 1/2 strength fruit juice) until the pain has been gone for over 2 hours. Then slowly return to a regular diet.  Rest:  Lie down and rest until you feel better.  Call Back If:  Abdominal pain is constant and present for more than 2 hours  You become worse.  Patient Will Follow Care Advice:  YES  Appointment Scheduled:  05/19/2014 13:45:00 Appointment Scheduled Provider:  Alysia Penna [facility])

## 2014-05-19 NOTE — Progress Notes (Signed)
   Subjective:    Patient ID: Heather Oneal, female    DOB: 1941-08-07, 73 y.o.   MRN: [id number]  Abdominal Pain Associated symptoms include constipation. Pertinent negatives include no diarrhea, dysuria, fever, nausea or vomiting.   Abdominal pain. Onset was aound 6 AM today. She had 3 small volume bowel movements earlier today and pain is 80% better this time. She denies any fever or chills. She had bilateral mild lower abdominal "soreness". Bloated feeling. She has noted that recently some of her stools are somewhat thin and she does frequently have small volume stools. She denies any dysuria. No fevers or chills. No nausea or vomiting.  Patient had colonoscopy estimated over 10 years ago. This apparently was with [facility]. That gastroenterologist (Dr Yvette Rack) has since left the area. Patient has no family history of colon cancer. Brother with Crohn's disease. She denies any appetite or weight changes.  Past Medical History  Diagnosis Date  . Depression   . Allergy   . History of IBS   . Hyperlipidemia   . HYPERLIPIDEMIA 01/28/2010  . DEPRESSION 01/28/2010   Past Surgical History  Procedure Laterality Date  . Ovarian cyst surgery  1973    ruptured ovarian cyst  . Appendectomy  1973  . Cholecystectomy  2008  . Tonsillectomy  1948    reports that she has never smoked. She does not have any smokeless tobacco history on file. Her alcohol and drug histories are not on file. family history includes Cancer (age of onset: 48) in her sister; Crohn's disease in her brother; Diabetes in her maternal grandmother; Heart disease in her mother and paternal grandfather; Hypertension in her mother. Allergies  Allergen Reactions  . Crestor [Rosuvastatin]     " Ears itching and throat burning"      Review of Systems  Constitutional: Negative for fever, chills, appetite change and unexpected weight change.  Respiratory: Negative for cough and shortness of breath.     Cardiovascular: Negative for chest pain.  Gastrointestinal: Positive for abdominal pain and constipation. Negative for nausea, vomiting, diarrhea, blood in stool and abdominal distention.  Genitourinary: Negative for dysuria and pelvic pain.       Objective:   Physical Exam  Constitutional: She appears well-developed and well-nourished.  Neck: Neck supple.  Cardiovascular: Normal rate.   Pulmonary/Chest: Effort normal and breath sounds normal. No respiratory distress. She has no wheezes. She has no rales.  Abdominal: Soft. Bowel sounds are normal. She exhibits no distension and no mass. There is no tenderness. There is no rebound and no guarding.  Skin: No rash noted.          Assessment & Plan:  Diffuse abdominal pain this morning which has essentially resolved. She's had some recent constipation and suspect this is most likely etiology. Her symptoms improved after bowel movement. She has totally nonfocal exam and no recent fever or other suggestion of infection. She's had previous appendectomy. No recent dysuria. Discussed measures to reduce constipation. Set up repeat colonoscopy screening.

## 2014-05-19 NOTE — Patient Instructions (Signed)
Constipation, Adult Constipation is when a person has fewer than 3 bowel movements a week; has difficulty having a bowel movement; or has stools that are dry, hard, or larger than normal. As people grow older, constipation is more common. If you try to fix constipation with medicines that make you have a bowel movement (laxatives), the problem may get worse. Long-term laxative use may cause the muscles of the colon to become weak. A low-fiber diet, not taking in enough fluids, and taking certain medicines may make constipation worse. CAUSES   Certain medicines, such as antidepressants, pain medicine, iron supplements, antacids, and water pills.   Certain diseases, such as diabetes, irritable bowel syndrome (IBS), thyroid disease, or depression.   Not drinking enough water.   Not eating enough fiber-rich foods.   Stress or travel.  Lack of physical activity or exercise.  Not going to the restroom when there is the urge to have a bowel movement.  Ignoring the urge to have a bowel movement.  Using laxatives too much. SYMPTOMS   Having fewer than 3 bowel movements a week.   Straining to have a bowel movement.   Having hard, dry, or larger than normal stools.   Feeling full or bloated.   Pain in the lower abdomen.  Not feeling relief after having a bowel movement. DIAGNOSIS  Your caregiver will take a medical history and perform a physical exam. Further testing may be done for severe constipation. Some tests may include:   A barium enema X-ray to examine your rectum, colon, and sometimes, your small intestine.  A sigmoidoscopy to examine your lower colon.  A colonoscopy to examine your entire colon. TREATMENT  Treatment will depend on the severity of your constipation and what is causing it. Some dietary treatments include drinking more fluids and eating more fiber-rich foods. Lifestyle treatments may include regular exercise. If these diet and lifestyle recommendations  do not help, your caregiver may recommend taking over-the-counter laxative medicines to help you have bowel movements. Prescription medicines may be prescribed if over-the-counter medicines do not work.  HOME CARE INSTRUCTIONS   Increase dietary fiber in your diet, such as fruits, vegetables, whole grains, and beans. Limit high-fat and processed sugars in your diet, such as [place] fries, hamburgers, cookies, candies, and soda.   A fiber supplement may be added to your diet if you cannot get enough fiber from foods.   Drink enough fluids to keep your urine clear or pale yellow.   Exercise regularly or as directed by your caregiver.   Go to the restroom when you have the urge to go. Do not hold it.  Only take medicines as directed by your caregiver. Do not take other medicines for constipation without talking to your caregiver first. Calvin IF:   You have bright red blood in your stool.   Your constipation lasts for more than 4 days or gets worse.   You have abdominal or rectal pain.   You have thin, pencil-like stools.  You have unexplained weight loss. MAKE SURE YOU:   Understand these instructions.  Will watch your condition.  Will get help right away if you are not doing well or get worse. Document Released: 09/05/2004 Document Revised: 03/01/2012 Document Reviewed: 09/19/2013 [facility] Patient Information 2014 [place], [place].  We will call you regarding colonoscopy screening.

## 2014-05-24 ENCOUNTER — Encounter: Payer: Self-pay | Admitting: Internal Medicine

## 2014-06-14 ENCOUNTER — Encounter: Payer: Self-pay | Admitting: Cardiovascular Disease

## 2014-07-07 ENCOUNTER — Ambulatory Visit (AMBULATORY_SURGERY_CENTER): Payer: Self-pay

## 2014-07-07 VITALS — Ht 67.0 in | Wt 232.0 lb

## 2014-07-07 DIAGNOSIS — Z1211 Encounter for screening for malignant neoplasm of colon: Secondary | ICD-10-CM

## 2014-07-07 MED ORDER — MOVIPREP 100 G PO SOLR: 1.0000 | Freq: Once | ORAL | Status: DC

## 2014-07-07 NOTE — Progress Notes (Signed)
No allergies to eggs or soy No past problems with anesthesia No home oxygen No diet/weight loss meds  Has email  Emmi instructions given for colonoscopy 

## 2014-07-19 ENCOUNTER — Encounter: Payer: Self-pay | Admitting: Internal Medicine

## 2014-07-19 ENCOUNTER — Ambulatory Visit (AMBULATORY_SURGERY_CENTER): Payer: Medicare Other | Admitting: Internal Medicine

## 2014-07-19 VITALS — BP 126/75 | HR 107 | Temp 97.2°F | Resp 32 | Ht 67.0 in | Wt 232.0 lb

## 2014-07-19 DIAGNOSIS — Z1211 Encounter for screening for malignant neoplasm of colon: Secondary | ICD-10-CM

## 2014-07-19 DIAGNOSIS — D126 Benign neoplasm of colon, unspecified: Secondary | ICD-10-CM

## 2014-07-19 MED ORDER — SODIUM CHLORIDE 0.9 % IV SOLN: 500.0000 mL | INTRAVENOUS | Status: DC

## 2014-07-19 NOTE — Progress Notes (Signed)
No complaints noted in the recovery room. Maw   

## 2014-07-19 NOTE — Patient Instructions (Signed)

## 2014-07-19 NOTE — Progress Notes (Signed)
A/ox3 pleased with MAC, report to Annette RN 

## 2014-07-19 NOTE — Progress Notes (Signed)
Called to room to assist during endoscopic procedure.  Patient ID and intended procedure confirmed with present staff. Received instructions for my participation in the procedure from the performing physician.  

## 2014-07-19 NOTE — Op Note (Signed)
[place]  [address] [place], [address]   COLONOSCOPY PROCEDURE REPORT  PATIENT: Heather Oneal, Heather Oneal  MR#: [id number] BIRTHDATE: [birth year] , 72  yrs. old GENDER: Female ENDOSCOPIST: Eustace Quail, MD REFERRED OH:YWVPX Burchette, M.D. PROCEDURE DATE:  07/19/2014 PROCEDURE:   Colonoscopy with snare polypectomy x4 First Screening Colonoscopy - Avg.  risk and is 50 yrs.  old or older - No.  Prior Negative Screening - Now for repeat screening. 10 or more years since last screening  History of Adenoma - Now for follow-up colonoscopy & has been > or = to 3 yrs.  N/A  Polyps Removed Today? Yes. ASA CLASS:   Class II INDICATIONS:average risk screening. Reports negative colonoscopy elsewhere 10+ years ago. MEDICATIONS: MAC sedation, administered by CRNA and propofol (Diprivan) 350mg  IV  DESCRIPTION OF PROCEDURE:   After the risks benefits and alternatives of the procedure were thoroughly explained, informed consent was obtained.  A digital rectal exam revealed no abnormalities of the rectum.   The LB [id number]  endoscope was introduced through the anus and advanced to the cecum, which was identified by both the appendix and ileocecal valve. No adverse events experienced.   The quality of the prep was excellent, using MoviPrep  The instrument was then slowly withdrawn as the colon was fully examined.  COLON FINDINGS: Four polyps were found: 7 mm sessile at the cecum,3 mm, 4 mm in the ascending colon, and 5 mm in the sigmoid colon.  A polypectomy was performed with a cold snare.  The resection was complete and the polyp tissue was completely retrieved.   Mild diverticulosis was in the left colon.   The colon mucosa was otherwise normal.  Retroflexed views revealed internal hemorrhoids. The time to cecum=3 min 11 sec.  Withdrawal time=15 min 24 sec. The scope was withdrawn and the procedure completed. COMPLICATIONS: There were no  complications.  ENDOSCOPIC IMPRESSION: 1.   Four polyps were found in the colon; polypectomy was performed with a cold snare 2.   Mild diverticulosis was noted in the left colon 3.   The colon mucosa was otherwise normal  RECOMMENDATIONS: 1. Follow up colonoscopy in 3 or 5 years (pending the pathology results)   eSigned:  Eustace Quail, MD 07/19/2014 10:27 AM   cc: Carolann Littler, MD and The Patient

## 2014-07-20 ENCOUNTER — Telehealth: Payer: Self-pay | Admitting: *Deleted

## 2014-07-20 NOTE — Telephone Encounter (Signed)
  Follow up Call-  Call back number 07/19/2014  Post procedure Call Back phone  # [phone]  Permission to leave phone message Yes    [facility]

## 2014-07-24 ENCOUNTER — Encounter: Payer: Self-pay | Admitting: Internal Medicine

## 2014-08-03 ENCOUNTER — Other Ambulatory Visit: Payer: Medicare Other

## 2014-08-14 ENCOUNTER — Other Ambulatory Visit (INDEPENDENT_AMBULATORY_CARE_PROVIDER_SITE_OTHER): Payer: Medicare Other

## 2014-08-14 DIAGNOSIS — E785 Hyperlipidemia, unspecified: Secondary | ICD-10-CM

## 2014-08-14 DIAGNOSIS — R7989 Other specified abnormal findings of blood chemistry: Secondary | ICD-10-CM

## 2014-08-14 LAB — LIPID PANEL
Cholesterol: 258 mg/dL — ABNORMAL HIGH (ref 0–200)
HDL: 48.5 mg/dL (ref >39.00)
NonHDL: 209.5
Total CHOL/HDL Ratio: 5
Triglycerides: 215 mg/dL — ABNORMAL HIGH (ref 0.0–149.0)
VLDL: 43 mg/dL — AB (ref 0.0–40.0)

## 2014-08-14 LAB — LDL CHOLESTEROL, DIRECT: LDL DIRECT: 198.6 mg/dL

## 2015-06-08 ENCOUNTER — Encounter: Payer: Self-pay | Admitting: Family Medicine

## 2015-06-08 ENCOUNTER — Ambulatory Visit (INDEPENDENT_AMBULATORY_CARE_PROVIDER_SITE_OTHER): Payer: PPO | Admitting: Family Medicine

## 2015-06-08 VITALS — BP 120/80 | HR 71 | Temp 97.5°F | Wt 228.0 lb

## 2015-06-08 DIAGNOSIS — M545 Low back pain, unspecified: Secondary | ICD-10-CM

## 2015-06-08 MED ORDER — CYCLOBENZAPRINE HCL 5 MG PO TABS: 5.0000 mg | ORAL_TABLET | Freq: Three times a day (TID) | ORAL | Status: DC | PRN

## 2015-06-08 NOTE — Patient Instructions (Signed)
Low Back Sprain with Rehab  A sprain is an injury in which a ligament is torn. The ligaments of the lower back are vulnerable to sprains. However, they are strong and require great force to be injured. These ligaments are important for stabilizing the spinal column. Sprains are classified into three categories. Grade 1 sprains cause pain, but the tendon is not lengthened. Grade 2 sprains include a lengthened ligament, due to the ligament being stretched or partially ruptured. With grade 2 sprains there is still function, although the function may be decreased. Grade 3 sprains involve a complete tear of the tendon or muscle, and function is usually impaired. SYMPTOMS   Severe pain in the lower back.  Sometimes, a feeling of a "pop," "snap," or tear, at the time of injury.  Tenderness and sometimes swelling at the injury site.  Uncommonly, bruising (contusion) within 48 hours of injury.  Muscle spasms in the back. CAUSES  Low back sprains occur when a force is placed on the ligaments that is greater than they can handle. Common causes of injury include:  Performing a stressful act while off-balance.  Repetitive stressful activities that involve movement of the lower back.  Direct hit (trauma) to the lower back. RISK INCREASES WITH:  Contact sports (football, wrestling).  Collisions (major skiing accidents).  Sports that require throwing or lifting (baseball, weightlifting).  Sports involving twisting of the spine (gymnastics, diving, tennis, golf).  Poor strength and flexibility.  Inadequate protection.  Previous back injury or surgery (especially fusion). PREVENTION  Wear properly fitted and padded protective equipment.  Warm up and stretch properly before activity.  Allow for adequate recovery between workouts.  Maintain physical fitness:  Strength, flexibility, and endurance.  Cardiovascular fitness.  Maintain a healthy body weight. PROGNOSIS  If treated  properly, low back sprains usually heal with non-surgical treatment. The length of time for healing depends on the severity of the injury.  RELATED COMPLICATIONS   Recurring symptoms, resulting in a chronic problem.  Chronic inflammation and pain in the low back.  Delayed healing or resolution of symptoms, especially if activity is resumed too soon.  Prolonged impairment.  Unstable or arthritic joints of the low back. TREATMENT  Treatment first involves the use of ice and medicine, to reduce pain and inflammation. The use of strengthening and stretching exercises may help reduce pain with activity. These exercises may be performed at home or with a therapist. Severe injuries may require referral to a therapist for further evaluation and treatment, such as ultrasound. Your caregiver may advise that you wear a back brace or corset, to help reduce pain and discomfort. Often, prolonged bed rest results in greater harm then benefit. Corticosteroid injections may be recommended. However, these should be reserved for the most serious cases. It is important to avoid using your back when lifting objects. At night, sleep on your back on a firm mattress, with a pillow placed under your knees. If non-surgical treatment is unsuccessful, surgery may be needed.  MEDICATION   If pain medicine is needed, nonsteroidal anti-inflammatory medicines (aspirin and ibuprofen), or other minor pain relievers (acetaminophen), are often advised.  Do not take pain medicine for 7 days before surgery.  Prescription pain relievers may be given, if your caregiver thinks they are needed. Use only as directed and only as much as you need.  Ointments applied to the skin may be helpful.  Corticosteroid injections may be given by your caregiver. These injections should be reserved for the most serious cases,   because they may only be given a certain number of times. HEAT AND COLD  Cold treatment (icing) should be applied for 10  to 15 minutes every 2 to 3 hours for inflammation and pain, and immediately after activity that aggravates your symptoms. Use ice packs or an ice massage.  Heat treatment may be used before performing stretching and strengthening activities prescribed by your caregiver, physical therapist, or athletic trainer. Use a heat pack or a warm water soak. SEEK MEDICAL CARE IF:   Symptoms get worse or do not improve in 2 to 4 weeks, despite treatment.  You develop numbness or weakness in either leg.  You lose bowel or bladder function.  Any of the following occur after surgery: fever, increased pain, swelling, redness, drainage of fluids, or bleeding in the affected area.  New, unexplained symptoms develop. (Drugs used in treatment may produce side effects.) EXERCISES  RANGE OF MOTION (ROM) AND STRETCHING EXERCISES - Low Back Sprain Most people with lower back pain will find that their symptoms get worse with excessive bending forward (flexion) or arching at the lower back (extension). The exercises that will help resolve your symptoms will focus on the opposite motion.  Your physician, physical therapist or athletic trainer will help you determine which exercises will be most helpful to resolve your lower back pain. Do not complete any exercises without first consulting with your caregiver. Discontinue any exercises which make your symptoms worse, until you speak to your caregiver. If you have pain, numbness or tingling which travels down into your buttocks, leg or foot, the goal of the therapy is for these symptoms to move closer to your back and eventually resolve. Sometimes, these leg symptoms will get better, but your lower back pain may worsen. This is often an indication of progress in your rehabilitation. Be very alert to any changes in your symptoms and the activities in which you participated in the 24 hours prior to the change. Sharing this information with your caregiver will allow him or her to  most efficiently treat your condition. These exercises may help you when beginning to rehabilitate your injury. Your symptoms may resolve with or without further involvement from your physician, physical therapist or athletic trainer. While completing these exercises, remember:   Restoring tissue flexibility helps normal motion to return to the joints. This allows healthier, less painful movement and activity.  An effective stretch should be held for at least 30 seconds.  A stretch should never be painful. You should only feel a gentle lengthening or release in the stretched tissue. FLEXION RANGE OF MOTION AND STRETCHING EXERCISES: STRETCH - Flexion, Single Knee to Chest   Lie on a firm bed or floor with both legs extended in front of you.  Keeping one leg in contact with the floor, bring your opposite knee to your chest. Hold your leg in place by either grabbing behind your thigh or at your knee.  Pull until you feel a gentle stretch in your low back. Hold __________ seconds.  Slowly release your grasp and repeat the exercise with the opposite side. Repeat __________ times. Complete this exercise __________ times per day.  STRETCH - Flexion, Double Knee to Chest  Lie on a firm bed or floor with both legs extended in front of you.  Keeping one leg in contact with the floor, bring your opposite knee to your chest.  Tense your stomach muscles to support your back and then lift your other knee to your chest. Hold your legs   in place by either grabbing behind your thighs or at your knees.  Pull both knees toward your chest until you feel a gentle stretch in your low back. Hold __________ seconds.  Tense your stomach muscles and slowly return one leg at a time to the floor. Repeat __________ times. Complete this exercise __________ times per day.  STRETCH - Low Trunk Rotation  Lie on a firm bed or floor. Keeping your legs in front of you, bend your knees so they are both pointed toward the  ceiling and your feet are flat on the floor.  Extend your arms out to the side. This will stabilize your upper body by keeping your shoulders in contact with the floor.  Gently and slowly drop both knees together to one side until you feel a gentle stretch in your low back. Hold for __________ seconds.  Tense your stomach muscles to support your lower back as you bring your knees back to the starting position. Repeat the exercise to the other side. Repeat __________ times. Complete this exercise __________ times per day  EXTENSION RANGE OF MOTION AND FLEXIBILITY EXERCISES: STRETCH - Extension, Prone on Elbows   Lie on your stomach on the floor, a bed will be too soft. Place your palms about shoulder width apart and at the height of your head.  Place your elbows under your shoulders. If this is too painful, stack pillows under your chest.  Allow your body to relax so that your hips drop lower and make contact more completely with the floor.  Hold this position for __________ seconds.  Slowly return to lying flat on the floor. Repeat __________ times. Complete this exercise __________ times per day.  RANGE OF MOTION - Extension, Prone Press Ups  Lie on your stomach on the floor, a bed will be too soft. Place your palms about shoulder width apart and at the height of your head.  Keeping your back as relaxed as possible, slowly straighten your elbows while keeping your hips on the floor. You may adjust the placement of your hands to maximize your comfort. As you gain motion, your hands will come more underneath your shoulders.  Hold this position __________ seconds.  Slowly return to lying flat on the floor. Repeat __________ times. Complete this exercise __________ times per day.  RANGE OF MOTION- Quadruped, Neutral Spine   Assume a hands and knees position on a firm surface. Keep your hands under your shoulders and your knees under your hips. You may place padding under your knees for  comfort.  Drop your head and point your tailbone toward the ground below you. This will round out your lower back like an angry cat. Hold this position for __________ seconds.  Slowly lift your head and release your tail bone so that your back sags into a large arch, like an old horse.  Hold this position for __________ seconds.  Repeat this until you feel limber in your low back.  Now, find your "sweet spot." This will be the most comfortable position somewhere between the two previous positions. This is your neutral spine. Once you have found this position, tense your stomach muscles to support your low back.  Hold this position for __________ seconds. Repeat __________ times. Complete this exercise __________ times per day.  STRENGTHENING EXERCISES - Low Back Sprain These exercises may help you when beginning to rehabilitate your injury. These exercises should be done near your "sweet spot." This is the neutral, low-back arch, somewhere between fully rounded   and fully arched, that is your least painful position. When performed in this safe range of motion, these exercises can be used for people who have either a flexion or extension based injury. These exercises may resolve your symptoms with or without further involvement from your physician, physical therapist or athletic trainer. While completing these exercises, remember:   Muscles can gain both the endurance and the strength needed for everyday activities through controlled exercises.  Complete these exercises as instructed by your physician, physical therapist or athletic trainer. Increase the resistance and repetitions only as guided.  You may experience muscle soreness or fatigue, but the pain or discomfort you are trying to eliminate should never worsen during these exercises. If this pain does worsen, stop and make certain you are following the directions exactly. If the pain is still present after adjustments, discontinue the  exercise until you can discuss the trouble with your caregiver. STRENGTHENING - Deep Abdominals, Pelvic Tilt   Lie on a firm bed or floor. Keeping your legs in front of you, bend your knees so they are both pointed toward the ceiling and your feet are flat on the floor.  Tense your lower abdominal muscles to press your low back into the floor. This motion will rotate your pelvis so that your tail bone is scooping upwards rather than pointing at your feet or into the floor. With a gentle tension and even breathing, hold this position for __________ seconds. Repeat __________ times. Complete this exercise __________ times per day.  STRENGTHENING - Abdominals, Crunches   Lie on a firm bed or floor. Keeping your legs in front of you, bend your knees so they are both pointed toward the ceiling and your feet are flat on the floor. Cross your arms over your chest.  Slightly tip your chin down without bending your neck.  Tense your abdominals and slowly lift your trunk high enough to just clear your shoulder blades. Lifting higher can put excessive stress on the lower back and does not further strengthen your abdominal muscles.  Control your return to the starting position. Repeat __________ times. Complete this exercise __________ times per day.  STRENGTHENING - Quadruped, Opposite UE/LE Lift   Assume a hands and knees position on a firm surface. Keep your hands under your shoulders and your knees under your hips. You may place padding under your knees for comfort.  Find your neutral spine and gently tense your abdominal muscles so that you can maintain this position. Your shoulders and hips should form a rectangle that is parallel with the floor and is not twisted.  Keeping your trunk steady, lift your right hand no higher than your shoulder and then your left leg no higher than your hip. Make sure you are not holding your breath. Hold this position for __________ seconds.  Continuing to keep  your abdominal muscles tense and your back steady, slowly return to your starting position. Repeat with the opposite arm and leg. Repeat __________ times. Complete this exercise __________ times per day.  STRENGTHENING - Abdominals and Quadriceps, Straight Leg Raise   Lie on a firm bed or floor with both legs extended in front of you.  Keeping one leg in contact with the floor, bend the other knee so that your foot can rest flat on the floor.  Find your neutral spine, and tense your abdominal muscles to maintain your spinal position throughout the exercise.  Slowly lift your straight leg off the floor about 6 inches for a count   of 15, making sure to not hold your breath.  Still keeping your neutral spine, slowly lower your leg all the way to the floor. Repeat this exercise with each leg __________ times. Complete this exercise __________ times per day. POSTURE AND BODY MECHANICS CONSIDERATIONS - Low Back Sprain Keeping correct posture when sitting, standing or completing your activities will reduce the stress put on different body tissues, allowing injured tissues a chance to heal and limiting painful experiences. The following are general guidelines for improved posture. Your physician or physical therapist will provide you with any instructions specific to your needs. While reading these guidelines, remember:  The exercises prescribed by your provider will help you have the flexibility and strength to maintain correct postures.  The correct posture provides the best environment for your joints to work. All of your joints have less wear and tear when properly supported by a spine with good posture. This means you will experience a healthier, less painful body.  Correct posture must be practiced with all of your activities, especially prolonged sitting and standing. Correct posture is as important when doing repetitive low-stress activities (typing) as it is when doing a single heavy-load  activity (lifting). RESTING POSITIONS Consider which positions are most painful for you when choosing a resting position. If you have pain with flexion-based activities (sitting, bending, stooping, squatting), choose a position that allows you to rest in a less flexed posture. You would want to avoid curling into a fetal position on your side. If your pain worsens with extension-based activities (prolonged standing, working overhead), avoid resting in an extended position such as sleeping on your stomach. Most people will find more comfort when they rest with their spine in a more neutral position, neither too rounded nor too arched. Lying on a non-sagging bed on your side with a pillow between your knees, or on your back with a pillow under your knees will often provide some relief. Keep in mind, being in any one position for a prolonged period of time, no matter how correct your posture, can still lead to stiffness. PROPER SITTING POSTURE In order to minimize stress and discomfort on your spine, you must sit with correct posture. Sitting with good posture should be effortless for a healthy body. Returning to good posture is a gradual process. Many people can work toward this most comfortably by using various supports until they have the flexibility and strength to maintain this posture on their own. When sitting with proper posture, your ears will fall over your shoulders and your shoulders will fall over your hips. You should use the back of the chair to support your upper back. Your lower back will be in a neutral position, just slightly arched. You may place a small pillow or folded towel at the base of your lower back for  support.  When working at a desk, create an environment that supports good, upright posture. Without extra support, muscles tire, which leads to excessive strain on joints and other tissues. Keep these recommendations in mind: CHAIR:  A chair should be able to slide under your desk  when your back makes contact with the back of the chair. This allows you to work closely.  The chair's height should allow your eyes to be level with the upper part of your monitor and your hands to be slightly lower than your elbows. BODY POSITION  Your feet should make contact with the floor. If this is not possible, use a foot rest.  Keep your   ears over your shoulders. This will reduce stress on your neck and low back. INCORRECT SITTING POSTURES  If you are feeling tired and unable to assume a healthy sitting posture, do not slouch or slump. This puts excessive strain on your back tissues, causing more damage and pain. Healthier options include:  Using more support, like a lumbar pillow.  Switching tasks to something that requires you to be upright or walking.  Talking a brief walk.  Lying down to rest in a neutral-spine position. PROLONGED STANDING WHILE SLIGHTLY LEANING FORWARD  When completing a task that requires you to lean forward while standing in one place for a long time, place either foot up on a stationary 2-4 inch high object to help maintain the best posture. When both feet are on the ground, the lower back tends to lose its slight inward curve. If this curve flattens (or becomes too large), then the back and your other joints will experience too much stress, tire more quickly, and can cause pain. CORRECT STANDING POSTURES Proper standing posture should be assumed with all daily activities, even if they only take a few moments, like when brushing your teeth. As in sitting, your ears should fall over your shoulders and your shoulders should fall over your hips. You should keep a slight tension in your abdominal muscles to brace your spine. Your tailbone should point down to the ground, not behind your body, resulting in an over-extended swayback posture.  INCORRECT STANDING POSTURES  Common incorrect standing postures include a forward head, locked knees and/or an excessive  swayback. WALKING Walk with an upright posture. Your ears, shoulders and hips should all line-up. PROLONGED ACTIVITY IN A FLEXED POSITION When completing a task that requires you to bend forward at your waist or lean over a low surface, try to find a way to stabilize 3 out of 4 of your limbs. You can place a hand or elbow on your thigh or rest a knee on the surface you are reaching across. This will provide you more stability, so that your muscles do not tire as quickly. By keeping your knees relaxed, or slightly bent, you will also reduce stress across your lower back. CORRECT LIFTING TECHNIQUES DO :  Assume a wide stance. This will provide you more stability and the opportunity to get as close as possible to the object which you are lifting.  Tense your abdominals to brace your spine. Bend at the knees and hips. Keeping your back locked in a neutral-spine position, lift using your leg muscles. Lift with your legs, keeping your back straight.  Test the weight of unknown objects before attempting to lift them.  Try to keep your elbows locked down at your sides in order get the best strength from your shoulders when carrying an object.  Always ask for help when lifting heavy or awkward objects. INCORRECT LIFTING TECHNIQUES DO NOT:   Lock your knees when lifting, even if it is a small object.  Bend and twist. Pivot at your feet or move your feet when needing to change directions.  Assume that you can safely pick up even a paperclip without proper posture. Document Released: 12/08/2005 Document Revised: 03/01/2012 Document Reviewed: 03/22/2009 [employer] Patient Information 2015 [employer]. This information is not intended to replace advice given to you by your health care provider. Make sure you discuss any questions you have with your health care provider.  

## 2015-06-08 NOTE — Progress Notes (Signed)
Pre visit review using our clinic review tool, if applicable. No additional management support is needed unless otherwise documented below in the visit note. 

## 2015-06-08 NOTE — Progress Notes (Signed)
   Subjective:    Patient ID: Heather Oneal, female    DOB: 1941-04-10, 74 y.o.   MRN: [id number]  HPI Patient seen with low back pain. Onset yesterday. She was bending over and when she stood up she felt a "snap "sensation lower back region. She has low lumbar back pain left greater than right. Some radiation toward her left buttock. She's been icing and using Advil which helps somewhat. Worse with sitting up and extending back from a seated position. No urine or stool incontinence. No fevers or chills. Pain is mild to moderate. Worse with changing positions. Ambulating without difficulty.  Past Medical History  Diagnosis Date  . Depression   . Allergy   . History of IBS   . Hyperlipidemia   . HYPERLIPIDEMIA 01/28/2010  . DEPRESSION 01/28/2010   Past Surgical History  Procedure Laterality Date  . Ovarian cyst surgery  1973    ruptured ovarian cyst  . Appendectomy  1973  . Cholecystectomy  2008  . Tonsillectomy  1948    reports that she has never smoked. She has never used smokeless tobacco. She reports that she drinks alcohol. She reports that she does not use illicit drugs. family history includes Cancer (age of onset: 85) in her sister; Crohn's disease in her brother; Diabetes in her maternal grandmother; Heart disease in her mother and paternal grandfather; Hypertension in her mother; Stomach cancer in her paternal grandmother. There is no history of Colon cancer, Pancreatic cancer, Rectal cancer, or Esophageal cancer. Allergies  Allergen Reactions  . Crestor [Rosuvastatin]     " Ears itching and throat burning"      Review of Systems  Constitutional: Negative for fever, chills, appetite change and unexpected weight change.  Respiratory: Negative for cough.   Gastrointestinal: Negative for abdominal pain.  Genitourinary: Negative for dysuria.  Musculoskeletal: Positive for back pain.  Neurological: Negative for weakness and numbness.       Objective:   Physical Exam    Constitutional: She appears well-developed and well-nourished.  Cardiovascular: Normal rate and regular rhythm.   Pulmonary/Chest: Effort normal and breath sounds normal. No respiratory distress. She has no wheezes. She has no rales.  Musculoskeletal:  Straight leg raise are negative bilaterally  Neurological:  Symmetric reflexes ankle and knee bilaterally. Full-strength lower extremities. Normal sensory function to touch. No lower extremity edema.          Assessment & Plan:  Acute low back pain. Nonfocal neurologic exam. Suspect lumbar strain. Handout given on simple stretches. Continue Advil and icing. Low-dose Flexeril 5 mg daily at bedtime. Cautioned about sedation. Consider physical therapy if no improvement in 2 weeks

## 2015-09-28 ENCOUNTER — Other Ambulatory Visit: Payer: Self-pay | Admitting: Family Medicine

## 2016-03-10 DIAGNOSIS — H1045 Other chronic allergic conjunctivitis: Secondary | ICD-10-CM | POA: Diagnosis not present

## 2016-03-13 ENCOUNTER — Ambulatory Visit (INDEPENDENT_AMBULATORY_CARE_PROVIDER_SITE_OTHER): Payer: PPO | Admitting: Podiatry

## 2016-03-13 ENCOUNTER — Encounter: Payer: Self-pay | Admitting: Podiatry

## 2016-03-13 VITALS — BP 118/63 | HR 67 | Resp 14

## 2016-03-13 DIAGNOSIS — L84 Corns and callosities: Secondary | ICD-10-CM | POA: Diagnosis not present

## 2016-03-13 DIAGNOSIS — M2042 Other hammer toe(s) (acquired), left foot: Secondary | ICD-10-CM

## 2016-03-13 NOTE — Progress Notes (Addendum)
   Subjective:    Patient ID: Heather Oneal, female    DOB: 12-30-1940, 75 y.o.   MRN: [id number]  HPI this patient presents to the office with chief complaint painful fourth toe left foot. Patient states that the toe has become painful for the last 6-7 months. She has developed a corn on the outside of her fourth toe near her fifth toe. This corn is painful walking and wearing her shoes. She gives a history of injuring her left foot when she received a massage at [place]. She believes this was the causative factor for the swelling that she is experiencing in her left foot. She has provided no self treatment nor sought any professional help. She presents the office for an evaluation and treatment of this condition The patient presents here today with left foot, 2nd toe pain.   Review of Systems  All other systems reviewed and are negative.      Objective:   Physical Exam GENERAL APPEARANCE: Alert, conversant. Appropriately groomed. No acute distress.  VASCULAR: Pedal pulses are  palpable at  [facility] and PT bilateral.  Capillary refill time is immediate to all digits,  Normal temperature gradient.  Digital hair growth is present bilateral  NEUROLOGIC: sensation is normal to 5.07 monofilament at 5/5 sites bilateral.  Light touch is intact bilateral, Muscle strength normal.  MUSCULOSKELETAL: acceptable muscle strength, tone and stability bilateral.  Intrinsic muscluature intact bilateral.  Rectus appearance of foot and digits noted bilateral. Hallux limitus 1st MPJ left foot. Adducto-varus fifth toe left foot.    DERMATOLOGIC: skin color, texture, and turgor are within normal limits.  No preulcerative lesions or ulcers  are seen, no interdigital maceration noted.  No open lesions present.  Digital nails are asymptomatic. No drainage noted. Corn noted lateral aspect fourth toe left foot.         Assessment & Plan:  Hammer toe left foot   Hallux Limitus 1st MPJ left foot.  IE  Debrided corn.  Toe  cap dispensed.  Discussed her left foot with patient and told her to make an appointment with [place] and further work-up on her left foot arthritis.   Gardiner Barefoot DPM

## 2016-05-27 ENCOUNTER — Ambulatory Visit (INDEPENDENT_AMBULATORY_CARE_PROVIDER_SITE_OTHER): Payer: PPO | Admitting: Family Medicine

## 2016-05-27 ENCOUNTER — Telehealth: Payer: Self-pay | Admitting: Family Medicine

## 2016-05-27 ENCOUNTER — Encounter: Payer: Self-pay | Admitting: Family Medicine

## 2016-05-27 VITALS — BP 118/70 | HR 82 | Temp 98.5°F | Resp 20 | Ht 67.0 in | Wt 239.0 lb

## 2016-05-27 DIAGNOSIS — J309 Allergic rhinitis, unspecified: Secondary | ICD-10-CM | POA: Diagnosis not present

## 2016-05-27 DIAGNOSIS — R059 Cough, unspecified: Secondary | ICD-10-CM

## 2016-05-27 DIAGNOSIS — R05 Cough: Secondary | ICD-10-CM | POA: Diagnosis not present

## 2016-05-27 MED ORDER — FLUTICASONE PROPIONATE 50 MCG/ACT NA SUSP: 1.0000 | Freq: Every day | NASAL | Status: DC

## 2016-05-27 MED ORDER — MONTELUKAST SODIUM 10 MG PO TABS: 10.0000 mg | ORAL_TABLET | Freq: Every day | ORAL | Status: DC

## 2016-05-27 NOTE — Telephone Encounter (Signed)
Patient Name: Heather Oneal DOB: Apr 15, 1941 Initial Comment Caller states she has been coughing, and a sore throat. Believes it's seasonal allergies. Nurse Assessment Nurse: Ronnald Ramp, RN, Miranda Date/Time (Eastern Time): 05/27/2016 12:08:43 PM Confirm and document reason for call. If symptomatic, describe symptoms. You must click the next button to save text entered. ---Caller states she has had a sore throat off and on for a couple of days. She takes med for allergies. She has congestion and cough that is worse the last couple of days. Has the patient traveled out of the country within the last 30 days? ---No Does the patient have any new or worsening symptoms? ---Yes Will a triage be completed? ---Yes Related visit to physician within the last 2 weeks? ---No Does the PT have any chronic conditions? (i.e. diabetes, asthma, etc.) ---Yes List chronic conditions. ---Allergies Is this a behavioral health or substance abuse call? ---No Guidelines Guideline Title Affirmed Question Affirmed Notes Nasal Allergies (Hay Fever) Lots of coughing Final Disposition User See Physician within 24 Hours Jones, RN, Miranda Comments Caller states she has an appt scheduled with Dr. Elease Hashimoto for tomorrow but wants to be seen today. Appt scheduled for today at 4PM with Dr. Martinique. Appt with Dr. Elease Hashimoto for tomorrow will be cancelled. Referrals REFERRED TO PCP OFFICE Disagree/Comply: Comply

## 2016-05-27 NOTE — Progress Notes (Signed)
Pre visit review using our clinic review tool, if applicable. No additional management support is needed unless otherwise documented below in the visit note. 

## 2016-05-27 NOTE — Telephone Encounter (Signed)
Appointment today (6/6) @ 2:30.

## 2016-05-27 NOTE — Patient Instructions (Signed)
A few things to remember from today's visit:   1. Allergic rhinitis, unspecified allergic rhinitis type  - fexofenadine (ALLEGRA) 180 MG tablet; Take 180 mg by mouth daily. - fluticasone (FLONASE) 50 MCG/ACT nasal spray; Place 1 spray into both nostrils daily.  Dispense: 16 g; Refill: 2 - montelukast (SINGULAIR) 10 MG tablet; Take 1 tablet (10 mg total) by mouth at bedtime.  Dispense: 30 tablet; Refill: 2  2. Cough  Nasal saline drops and steam showers may help. Continue Allegra daily. Singulair and Flonase added today.  Follow in 3 months, before if needed.

## 2016-05-27 NOTE — Progress Notes (Signed)
Subjective:    Patient ID: Heather Oneal, female    DOB: 02-23-1941, 75 y.o.   MRN: [id number]  HPI   Heather Oneal is a 75 y.o.female here today complaining of 2 days of worsening days of respiratory symptoms.  + Nasal congestion, rhinorrhea, intermittent dysphonia, post nasal drainage, and cough. Odynophagia exacerbated by cough, better today.  She is not sure about fever this morning. No chills, decrease appetite, fatigue, myalgia, or skin rash.  She states that she always had these symptoms but seem to be worse for the past 2 days. Mild cough during the day, sometimes she brings up some sputum but thinks it is related with postnasal drainage. No chest pain, dyspnea, or wheezing.   No Hx of recent travel. No sick contact. No known insect bite. + Hx of allergies. No Hx of GERD.  She has been on OTC Allegra, started 3 weeks ago, helps some.    Review of Systems  Constitutional: Negative for fever, chills, activity change, appetite change and fatigue.  HENT: Positive for congestion, postnasal drip, sinus pressure and sneezing. Negative for ear pain, mouth sores, sore throat and trouble swallowing.   Eyes: Positive for discharge, redness and itching. Negative for pain and visual disturbance.  Respiratory: Positive for cough. Negative for shortness of breath and wheezing.   Cardiovascular: Negative for chest pain and palpitations.  Gastrointestinal: Negative for nausea, vomiting, abdominal pain and diarrhea.  Musculoskeletal: Negative for myalgias and neck pain.  Skin: Negative for rash.  Allergic/Immunologic: Positive for environmental allergies.  Neurological: Negative for syncope, weakness, numbness and headaches.  Hematological: Negative for adenopathy. Does not bruise/bleed easily.  Psychiatric/Behavioral: Negative for confusion and sleep disturbance.    No current outpatient prescriptions on file prior to visit.   No current facility-administered  medications on file prior to visit.     Past Medical History  Diagnosis Date  . Depression   . Allergy   . History of IBS   . Hyperlipidemia   . HYPERLIPIDEMIA 01/28/2010  . DEPRESSION 01/28/2010    Social History   Social History  . Marital Status: Married    Spouse Name: N/A  . Number of Children: N/A  . Years of Education: N/A   Social History Main Topics  . Smoking status: Never Smoker   . Smokeless tobacco: Never Used  . Alcohol Use: Yes     Comment: rarely  . Drug Use: No  . Sexual Activity: Not Asked   Other Topics Concern  . None   Social History Narrative    Filed Vitals:   05/27/16 1437  BP: 118/70  Pulse: 82  Temp: 98.5 F (36.9 C)  Resp: 20   Body mass index is 37.42 kg/(m^2).  SpO2 Readings from Last 3 Encounters:  05/27/16 95%  07/19/14 75%  05/19/14 95%       Objective:   Physical Exam  Constitutional: She is oriented to person, place, and time. She appears well-developed. She does not appear ill. No distress.  HENT:  Head: Atraumatic.  Right Ear: External ear and ear canal normal. A middle ear effusion (mild) is present.  Left Ear: Tympanic membrane, external ear and ear canal normal.  Nose: Rhinorrhea present. No mucosal edema. Right sinus exhibits no maxillary sinus tenderness and no frontal sinus tenderness. Left sinus exhibits no maxillary sinus tenderness and no frontal sinus tenderness.  Mouth/Throat: Uvula is midline, oropharynx is clear and moist and mucous membranes are normal. No oropharyngeal exudate, posterior oropharyngeal  edema or posterior oropharyngeal erythema.  Nasal voice. Hypertrophic turbinates. + Post nasal drainage.  Eyes: Conjunctivae and EOM are normal. Pupils are equal, round, and reactive to light.  Epiphora bilateral.   Neck: No muscular tenderness present.  Cardiovascular: Normal rate and regular rhythm.   No murmur heard. Pulmonary/Chest: Effort normal and breath sounds normal. No respiratory distress.  She has no wheezes. She has no rales.  Lymphadenopathy:       Head (right side): No submandibular adenopathy present.       Head (left side): No submandibular adenopathy present.    She has no cervical adenopathy.  Neurological: She is alert and oriented to person, place, and time. Coordination and gait normal.  Skin: Skin is warm. No rash noted.  Psychiatric: She has a normal mood and affect.  Well groomed, good eye contact.       Assessment & Plan:    Heather Oneal was seen today for cough, sore throat, allergies, nasal congestion, headache and sinus pressure.  Diagnoses and all orders for this visit:  Allergic rhinitis, unspecified allergic rhinitis type  Reported symptoms suggest exacerbation of allergic rhinitis, certainly a mild viral infection could be aggravating symptoms. Still symptomatic treatment is recommended. Today we added steroid nasal spray and Singulair, we discussed some side effects of medications (denies Hx of glaucoma). Continue Allegra 180 mg daily. Avoid decongestants or cold medications due to possible side effects. Auto inflation maneuvers were recommended, this might help with a mistake and tube dysfunction. F/U in 3 months.   -     fluticasone (FLONASE) 50 MCG/ACT nasal spray; Place 1 spray into both nostrils daily. -     montelukast (SINGULAIR) 10 MG tablet; Take 1 tablet (10 mg total) by mouth at bedtime.  Cough  We discussed possible causes, in this case I think it is related with allergic rhinitis and postnasal drainage. I don't think imaging is needed today but needs to be considered if not better. F/U in 3 months, before if needed.     -Patient advised to return or notify a doctor immediately if symptoms worsen or persist or new concerns arise, she voices understanding.     Betty G. Martinique, MD  [facility]. [place] office.

## 2016-05-28 ENCOUNTER — Ambulatory Visit: Payer: PPO | Admitting: Family Medicine

## 2016-06-03 ENCOUNTER — Emergency Department (HOSPITAL_COMMUNITY)
Admission: EM | Admit: 2016-06-03 | Discharge: 2016-06-03 | Disposition: A | Discharge status: Home / Self Care | Payer: PPO | Attending: Emergency Medicine | Admitting: Emergency Medicine

## 2016-06-03 ENCOUNTER — Emergency Department (HOSPITAL_COMMUNITY): Payer: PPO

## 2016-06-03 ENCOUNTER — Telehealth: Payer: Self-pay | Admitting: Family Medicine

## 2016-06-03 ENCOUNTER — Encounter (HOSPITAL_COMMUNITY): Payer: Self-pay

## 2016-06-03 DIAGNOSIS — F329 Major depressive disorder, single episode, unspecified: Secondary | ICD-10-CM | POA: Diagnosis not present

## 2016-06-03 DIAGNOSIS — J069 Acute upper respiratory infection, unspecified: Secondary | ICD-10-CM

## 2016-06-03 DIAGNOSIS — E785 Hyperlipidemia, unspecified: Secondary | ICD-10-CM | POA: Insufficient documentation

## 2016-06-03 DIAGNOSIS — R05 Cough: Secondary | ICD-10-CM | POA: Diagnosis not present

## 2016-06-03 NOTE — ED Notes (Signed)
Pt has had cough/nasal congestion x 10 days.  Pt went to [facility] and given flonase.  Told to take allegra and mucinex.  Not helping.  No fever

## 2016-06-03 NOTE — Discharge Instructions (Signed)
Your x-ray showed no signs of pneumonia and your exam was reassuring. Continue taking your Flonase and Allegra as previously prescribed. You may also take your Mucinex D as previously prescribed. Follow-up with your doctor in the next 3-4 days for reevaluation. Return to ED for new or worsening symptoms as we discussed.  Upper Respiratory Infection, Adult Most upper respiratory infections (URIs) are a viral infection of the air passages leading to the lungs. A URI affects the nose, throat, and upper air passages. The most common type of URI is nasopharyngitis and is typically referred to as "the common cold." URIs run their course and usually go away on their own. Most of the time, a URI does not require medical attention, but sometimes a bacterial infection in the upper airways can follow a viral infection. This is called a secondary infection. Sinus and middle ear infections are common types of secondary upper respiratory infections. Bacterial pneumonia can also complicate a URI. A URI can worsen asthma and chronic obstructive pulmonary disease (COPD). Sometimes, these complications can require emergency medical care and may be life threatening.  CAUSES Almost all URIs are caused by viruses. A virus is a type of germ and can spread from one person to another.  RISKS FACTORS You may be at risk for a URI if:   You smoke.   You have chronic heart or lung disease.  You have a weakened defense (immune) system.   You are very young or very old.   You have nasal allergies or asthma.  You work in crowded or poorly ventilated areas.  You work in health care facilities or schools. SIGNS AND SYMPTOMS  Symptoms typically develop 2-3 days after you come in contact with a cold virus. Most viral URIs last 7-10 days. However, viral URIs from the influenza virus (flu virus) can last 14-18 days and are typically more severe. Symptoms may include:   Runny or stuffy (congested) nose.   Sneezing.    Cough.   Sore throat.   Headache.   Fatigue.   Fever.   Loss of appetite.   Pain in your forehead, behind your eyes, and over your cheekbones (sinus pain).  Muscle aches.  DIAGNOSIS  Your health care provider may diagnose a URI by:  Physical exam.  Tests to check that your symptoms are not due to another condition such as:  Strep throat.  Sinusitis.  Pneumonia.  Asthma. TREATMENT  A URI goes away on its own with time. It cannot be cured with medicines, but medicines may be prescribed or recommended to relieve symptoms. Medicines may help:  Reduce your fever.  Reduce your cough.  Relieve nasal congestion. HOME CARE INSTRUCTIONS   Take medicines only as directed by your health care provider.   Gargle warm saltwater or take cough drops to comfort your throat as directed by your health care provider.  Use a warm mist humidifier or inhale steam from a shower to increase air moisture. This may make it easier to breathe.  Drink enough fluid to keep your urine clear or pale yellow.   Eat soups and other clear broths and maintain good nutrition.   Rest as needed.   Return to work when your temperature has returned to normal or as your health care provider advises. You may need to stay home longer to avoid infecting others. You can also use a face mask and careful hand washing to prevent spread of the virus.  Increase the usage of your inhaler if you have  asthma.   Do not use any tobacco products, including cigarettes, chewing tobacco, or electronic cigarettes. If you need help quitting, ask your health care provider. PREVENTION  The best way to protect yourself from getting a cold is to practice good hygiene.   Avoid oral or hand contact with people with cold symptoms.   Wash your hands often if contact occurs.  There is no clear evidence that vitamin C, vitamin E, echinacea, or exercise reduces the chance of developing a cold. However, it is  always recommended to get plenty of rest, exercise, and practice good nutrition.  SEEK MEDICAL CARE IF:   You are getting worse rather than better.   Your symptoms are not controlled by medicine.   You have chills.  You have worsening shortness of breath.  You have brown or red mucus.  You have yellow or brown nasal discharge.  You have pain in your face, especially when you bend forward.  You have a fever.  You have swollen neck glands.  You have pain while swallowing.  You have white areas in the back of your throat. SEEK IMMEDIATE MEDICAL CARE IF:   You have severe or persistent:  Headache.  Ear pain.  Sinus pain.  Chest pain.  You have chronic lung disease and any of the following:  Wheezing.  Prolonged cough.  Coughing up blood.  A change in your usual mucus.  You have a stiff neck.  You have changes in your:  Vision.  Hearing.  Thinking.  Mood. MAKE SURE YOU:   Understand these instructions.  Will watch your condition.  Will get help right away if you are not doing well or get worse.   This information is not intended to replace advice given to you by your health care provider. Make sure you discuss any questions you have with your health care provider.   Document Released: 06/03/2001 Document Revised: 04/24/2015 Document Reviewed: 03/15/2014 Elsevier Interactive Patient Education [employer].

## 2016-06-03 NOTE — Telephone Encounter (Signed)
Patient Name: Heather Oneal  DOB: 12/18/1941    Initial Comment Caller states she was seen on 06/06. She was seen because she thought she was having a reaction to seasonal allergies. Now she's coughing up a yellowish/green phlegm. She was on Allegra originally, but switched to Mucinex.    Nurse Assessment  Nurse: Thad Ranger RN, Denise Date/Time (Eastern Time): 06/03/2016 3:21:49 PM  Confirm and document reason for call. If symptomatic, describe symptoms. You must click the next button to save text entered. ---Caller states she was seen on 06/06. She was seen because she thought she was having a reaction to seasonal allergies. Now she's coughing up a yellowish/green phlegm. She was on Allegra originally, but switched to Mucinex. Pt sounds SOB w/min exertion.  Has the patient traveled out of the country within the last 30 days? ---Not Applicable  Does the patient have any new or worsening symptoms? ---Yes  Will a triage be completed? ---Yes  Related visit to physician within the last 2 weeks? ---Yes  Does the PT have any chronic conditions? (i.e. diabetes, asthma, etc.) ---No  Is this a behavioral health or substance abuse call? ---No     Guidelines    Guideline Title Affirmed Question Affirmed Notes  Cough - Acute Productive Difficulty breathing    Final Disposition User   Go to ED Now Thad Ranger, RN, [facility] - ED   Disagree/Comply: Comply

## 2016-06-03 NOTE — ED Provider Notes (Signed)
CSN: [id number]     Arrival date & time 06/03/16  1559 History  By signing my name below, I, Hansel Feinstein, attest that this documentation has been prepared under the direction and in the presence of  Comer Locket, PA-C. Electronically Signed: Hansel Feinstein, ED Scribe. 06/03/2016. 5:21 PM.    Chief Complaint  Patient presents with  . Cough  . Nasal Congestion   The history is provided by the patient. No language interpreter was used.   HPI Comments: Heather Oneal is a 75 y.o. female who presents to the Emergency Department complaining of moderate, ongoing, intermittent productive cough with green phlegm onset 10 days ago with associated nasal and chest congestion, post-nasal drip. 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 Per pt, she saw no relief of current symptoms with Allegra and tried Mucinex with minimal relief of congestion. Per pt, she then saw her PCP on 05/27/16, who recommended she continue Allegra in addition to trying Flonase. She states that she called the triage nurse at her PCP today as she saw no relief of her symptoms with this treatment and was referred to the ED for further evaluation. Pt reports sick contact with her husband, who recently had a similar cough weeks ago. She denies known fever, leg swelling, hemoptysis, SOB, CP. No h/o COPD, asthma, emphysema, DM, HTN. Pt is a non-smoker.    Past Medical History  Diagnosis Date  . Depression   . Allergy   . History of IBS   . Hyperlipidemia   . HYPERLIPIDEMIA 01/28/2010  . DEPRESSION 01/28/2010   Past Surgical History  Procedure Laterality Date  . Ovarian cyst surgery  1973    ruptured ovarian cyst  . Appendectomy  1973  . Cholecystectomy  2008  . Tonsillectomy  1948   Family History  Problem Relation Age of Onset  . Heart disease Mother   . Hypertension Mother   . Cancer Sister 10    breast  . Crohn's disease Brother   . Diabetes Maternal Grandmother   . Heart disease Paternal  Grandfather   . Stomach cancer Paternal Grandmother   . Colon cancer Neg Hx   . Pancreatic cancer Neg Hx   . Rectal cancer Neg Hx   . Esophageal cancer Neg Hx    Social History  Substance Use Topics  . Smoking status: Never Smoker   . Smokeless tobacco: Never Used  . Alcohol Use: Yes     Comment: rarely   OB History    No data available     Review of Systems A 10 point review of systems was completed and was negative except for pertinent positives and negatives as mentioned in the history of present illness.   Allergies  Crestor  Home Medications   Prior to Admission medications   Medication Sig Start Date End Date Taking? Authorizing Provider  fexofenadine (ALLEGRA) 180 MG tablet Take 180 mg by mouth daily.    Historical Provider, MD  fluticasone (FLONASE) 50 MCG/ACT nasal spray Place 1 spray into both nostrils daily. 05/27/16   Betty G Martinique, MD  montelukast (SINGULAIR) 10 MG tablet Take 1 tablet (10 mg total) by mouth at bedtime. 05/27/16   Betty G Martinique, MD   BP 146/83 mmHg  Pulse 82  Temp(Src) 97.5 F (36.4 C) (Oral)  Resp 18  SpO2 96% Physical Exam  Constitutional: She is oriented to person, place, and time. She appears well-developed and well-nourished.  HENT:  Head: Normocephalic.  Nose:  Rhinorrhea present.  Positive rhinorrhea and congestion.   Eyes: Conjunctivae are normal.  Cardiovascular: Normal rate, regular rhythm and normal heart sounds.   No murmur heard. Heart sounds normal. RRR.    Pulmonary/Chest: Effort normal and breath sounds normal. No respiratory distress. She has no wheezes. She has no rales.  Lungs CTA bilaterally.   Abdominal: She exhibits no distension.  Musculoskeletal: Normal range of motion.  Neurological: She is alert and oriented to person, place, and time.  Skin: Skin is warm and dry.  Psychiatric: She has a normal mood and affect. Her behavior is normal.  Nursing note and vitals reviewed.   ED Course  Procedures (including  critical care time) DIAGNOSTIC STUDIES: Oxygen Saturation is 96% on RA, adequate by my interpretation.    COORDINATION OF CARE: 5:15 PM Discussed treatment plan with pt at bedside which includes CXR and pt agreed to plan.  Meds given in ED:  Medications - No data to display  New Prescriptions   No medications on file     Filed Vitals:   06/03/16 1638 06/03/16 1735  BP:    Pulse: 95 82  Temp:    Resp:  18      Labs Review Labs Reviewed - No data to display  Imaging Review Dg Chest 2 View  06/03/2016  CLINICAL DATA:  Pt has had cough/nasal congestion x 10 days. Pt went to [facility] and given flonase. Told to take allegra and mucinex. Not helping. No fever, non-smoker. EXAM: CHEST  2 VIEW COMPARISON:  08/19/2004 FINDINGS: Cardiac silhouette is normal in size and configuration. The aorta is uncoiled and tortuous. No mediastinal or hilar masses or evidence of adenopathy. Clear lungs. No pleural effusion or pneumothorax. The bony thorax is intact. IMPRESSION: No active cardiopulmonary disease. Electronically Signed   By: Lajean Manes M.D.   On: 06/03/2016 16:36   I have personally reviewed and evaluated these images and lab results as part of my medical decision-making.   EKG Interpretation None     Filed Vitals:   06/03/16 1608 06/03/16 1638 06/03/16 1735  BP: 146/83    Pulse: 93 95 82  Temp: 97.5 F (36.4 C)    TempSrc: Oral    Resp:   18  SpO2: 92% 96% 96%    MDM   Final diagnoses:  URI (upper respiratory infection)     Pt CXR negative for acute infiltrate. Patients symptoms are consistent with URI, likely viral etiology. Discussed that antibiotics are not indicated for viral infections. Pt will be discharged with symptomatic treatment. Encouraged to continue taking previously prescribed Flonase, Mucinex D. Verbalizes understanding and is agreeable with plan. Discussed follow-up with PCP in 3-4 days for reevaluation. Pt is hemodynamically stable & in NAD prior to  dc.   Comer Locket, PA-C 06/03/16 1740  Charlesetta Shanks, MD 06/07/16 1600

## 2016-06-12 ENCOUNTER — Other Ambulatory Visit (INDEPENDENT_AMBULATORY_CARE_PROVIDER_SITE_OTHER): Payer: PPO

## 2016-06-12 DIAGNOSIS — Z0000 Encounter for general adult medical examination without abnormal findings: Secondary | ICD-10-CM | POA: Diagnosis not present

## 2016-06-12 LAB — CBC WITH DIFFERENTIAL/PLATELET
BASOS PCT: 0.3 % (ref 0.0–3.0)
Basophils Absolute: 0 10*3/uL (ref 0.0–0.1)
EOS ABS: 0.2 10*3/uL (ref 0.0–0.7)
EOS PCT: 3.1 % (ref 0.0–5.0)
HEMATOCRIT: 38.1 % (ref 36.0–46.0)
HEMOGLOBIN: 12.6 g/dL (ref 12.0–15.0)
LYMPHS PCT: 30.4 % (ref 12.0–46.0)
Lymphs Abs: 2.4 10*3/uL (ref 0.7–4.0)
MCHC: 33.2 g/dL (ref 30.0–36.0)
MCV: 85.5 fl (ref 78.0–100.0)
MONO ABS: 0.7 10*3/uL (ref 0.1–1.0)
Monocytes Relative: 8.4 % (ref 3.0–12.0)
NEUTROS ABS: 4.6 10*3/uL (ref 1.4–7.7)
Neutrophils Relative %: 57.8 % (ref 43.0–77.0)
PLATELETS: 433 10*3/uL — AB (ref 150.0–400.0)
RBC: 4.45 Mil/uL (ref 3.87–5.11)
RDW: 14.6 % (ref 11.5–15.5)
WBC: 8 10*3/uL (ref 4.0–10.5)

## 2016-06-12 LAB — TSH: TSH: 3 u[IU]/mL (ref 0.35–4.50)

## 2016-06-12 LAB — HEPATIC FUNCTION PANEL
ALT: 12 U/L (ref 0–35)
AST: 13 U/L (ref 0–37)
Albumin: 4 g/dL (ref 3.5–5.2)
Alkaline Phosphatase: 70 U/L (ref 39–117)
BILIRUBIN DIRECT: 0.1 mg/dL (ref 0.0–0.3)
TOTAL PROTEIN: 7.2 g/dL (ref 6.0–8.3)
Total Bilirubin: 0.5 mg/dL (ref 0.2–1.2)

## 2016-06-12 LAB — LIPID PANEL
CHOLESTEROL: 233 mg/dL — AB (ref 0–200)
HDL: 47.2 mg/dL (ref >39.00)
LDL Cholesterol: 154 mg/dL — ABNORMAL HIGH (ref 0–99)
NonHDL: 186.14
TRIGLYCERIDES: 159 mg/dL — AB (ref 0.0–149.0)
Total CHOL/HDL Ratio: 5
VLDL: 31.8 mg/dL (ref 0.0–40.0)

## 2016-06-12 LAB — BASIC METABOLIC PANEL
BUN: 15 mg/dL (ref 6–23)
CHLORIDE: 102 meq/L (ref 96–112)
CO2: 31 meq/L (ref 19–32)
Calcium: 9.3 mg/dL (ref 8.4–10.5)
Creatinine, Ser: 0.71 mg/dL (ref 0.40–1.20)
GFR: 85.37 mL/min (ref >60.00)
GLUCOSE: 95 mg/dL (ref 70–99)
POTASSIUM: 4.2 meq/L (ref 3.5–5.1)
SODIUM: 138 meq/L (ref 135–145)

## 2016-06-18 ENCOUNTER — Ambulatory Visit (INDEPENDENT_AMBULATORY_CARE_PROVIDER_SITE_OTHER): Payer: PPO | Admitting: Family Medicine

## 2016-06-18 ENCOUNTER — Encounter: Payer: Self-pay | Admitting: Family Medicine

## 2016-06-18 VITALS — BP 102/70 | HR 100 | Temp 97.9°F | Ht 67.0 in | Wt 239.0 lb

## 2016-06-18 DIAGNOSIS — Z0000 Encounter for general adult medical examination without abnormal findings: Secondary | ICD-10-CM

## 2016-06-18 NOTE — Patient Instructions (Signed)
Food Choices to Lower Your Triglycerides Triglycerides are a type of fat in your blood. High levels of triglycerides can increase the risk of heart disease and stroke. If your triglyceride levels are high, the foods you eat and your eating habits are very important. Choosing the right foods can help lower your triglycerides.  WHAT GENERAL GUIDELINES DO I NEED TO FOLLOW?  Lose weight if you are overweight.   Limit or avoid alcohol.   Fill one half of your plate with vegetables and green salads.   Limit fruit to two servings a day. Choose fruit instead of juice.   Make one fourth of your plate whole grains. Look for the word "whole" as the first word in the ingredient list.  Fill one fourth of your plate with lean protein foods.  Enjoy fatty fish (such as salmon, mackerel, sardines, and tuna) three times a week.   Choose healthy fats.   Limit foods high in starch and sugar.  Eat more home-cooked food and less restaurant, buffet, and fast food.  Limit fried foods.  Cook foods using methods other than frying.  Limit saturated fats.  Check ingredient lists to avoid foods with partially hydrogenated oils (trans fats) in them. WHAT FOODS CAN I EAT?  Grains Whole grains, such as whole wheat or whole grain breads, crackers, cereals, and pasta. Unsweetened oatmeal, bulgur, barley, quinoa, or brown rice. Corn or whole wheat flour tortillas.  Vegetables Fresh or frozen vegetables (raw, steamed, roasted, or grilled). Green salads. Fruits All fresh, canned (in natural juice), or frozen fruits. Meat and Other Protein Products Ground beef (85% or leaner), grass-fed beef, or beef trimmed of fat. Skinless chicken or turkey. Ground chicken or turkey. Pork trimmed of fat. All fish and seafood. Eggs. Dried beans, peas, or lentils. Unsalted nuts or seeds. Unsalted canned or dry beans. Dairy Low-fat dairy products, such as skim or 1% milk, 2% or reduced-fat cheeses, low-fat ricotta or cottage  cheese, or plain low-fat yogurt. Fats and Oils Tub margarines without trans fats. Light or reduced-fat mayonnaise and salad dressings. Avocado. Safflower, olive, or canola oils. Natural peanut or almond butter. The items listed above may not be a complete list of recommended foods or beverages. Contact your dietitian for more options. WHAT FOODS ARE NOT RECOMMENDED?  Grains White bread. White pasta. White rice. Cornbread. Bagels, pastries, and croissants. Crackers that contain trans fat. Vegetables White potatoes. Corn. Creamed or fried vegetables. Vegetables in a cheese sauce. Fruits Dried fruits. Canned fruit in light or heavy syrup. Fruit juice. Meat and Other Protein Products Fatty cuts of meat. Ribs, chicken wings, bacon, sausage, bologna, salami, chitterlings, fatback, hot dogs, bratwurst, and packaged luncheon meats. Dairy Whole or 2% milk, cream, half-and-half, and cream cheese. Whole-fat or sweetened yogurt. Full-fat cheeses. Nondairy creamers and whipped toppings. Processed cheese, cheese spreads, or cheese curds. Sweets and Desserts Corn syrup, sugars, honey, and molasses. Candy. Jam and jelly. Syrup. Sweetened cereals. Cookies, pies, cakes, donuts, muffins, and ice cream. Fats and Oils Butter, stick margarine, lard, shortening, ghee, or bacon fat. Coconut, palm kernel, or palm oils. Beverages Alcohol. Sweetened drinks (such as sodas, lemonade, and fruit drinks or punches). The items listed above may not be a complete list of foods and beverages to avoid. Contact your dietitian for more information.   This information is not intended to replace advice given to you by your health care provider. Make sure you discuss any questions you have with your health care provider.   Document Released: 09/25/2004   Document Revised: 12/29/2014 Document Reviewed: 10/12/2013 Elsevier Interactive Patient Education 2016 [employer]. Fat and Cholesterol Restricted Diet High levels of fat and  cholesterol in your blood may lead to various health problems, such as diseases of the heart, blood vessels, gallbladder, liver, and pancreas. Fats are concentrated sources of energy that come in various forms. Certain types of fat, including saturated fat, may be harmful in excess. Cholesterol is a substance needed by your body in small amounts. Your body makes all the cholesterol it needs. Excess cholesterol comes from the food you eat. When you have high levels of cholesterol and saturated fat in your blood, health problems can develop because the excess fat and cholesterol will gather along the walls of your blood vessels, causing them to narrow. Choosing the right foods will help you control your intake of fat and cholesterol. This will help keep the levels of these substances in your blood within normal limits and reduce your risk of disease. WHAT IS MY PLAN? Your health care provider recommends that you:  Get no more than __________ % of the total calories in your daily diet from fat.  Limit your intake of saturated fat to less than ______% of your total calories each day.  Limit the amount of cholesterol in your diet to less than _________mg per day. WHAT TYPES OF FAT SHOULD I CHOOSE?  Choose healthy fats more often. Choose monounsaturated and polyunsaturated fats, such as olive and canola oil, flaxseeds, walnuts, almonds, and seeds.  Eat more omega-3 fats. Good choices include salmon, mackerel, sardines, tuna, flaxseed oil, and ground flaxseeds. Aim to eat fish at least two times a week.  Limit saturated fats. Saturated fats are primarily found in animal products, such as meats, butter, and cream. Plant sources of saturated fats include palm oil, palm kernel oil, and coconut oil.  Avoid foods with partially hydrogenated oils in them. These contain trans fats. Examples of foods that contain trans fats are stick margarine, some tub margarines, cookies, crackers, and other baked goods. WHAT  GENERAL GUIDELINES DO I NEED TO FOLLOW? These guidelines for healthy eating will help you control your intake of fat and cholesterol:  Check food labels carefully to identify foods with trans fats or high amounts of saturated fat.  Fill one half of your plate with vegetables and green salads.  Fill one fourth of your plate with whole grains. Look for the word "whole" as the first word in the ingredient list.  Fill one fourth of your plate with lean protein foods.  Limit fruit to two servings a day. Choose fruit instead of juice.  Eat more foods that contain soluble fiber. Examples of foods that contain this type of fiber are apples, broccoli, carrots, beans, peas, and barley. Aim to get 20-30 g of fiber per day.  Eat more home-cooked food and less restaurant, buffet, and fast food.  Limit or avoid alcohol.  Limit foods high in starch and sugar.  Limit fried foods.  Cook foods using methods other than frying. Baking, boiling, grilling, and broiling are all great options.  Lose weight if you are overweight. Losing just 5-10% of your initial body weight can help your overall health and prevent diseases such as diabetes and heart disease. WHAT FOODS CAN I EAT? Grains Whole grains, such as whole wheat or whole grain breads, crackers, cereals, and pasta. Unsweetened oatmeal, bulgur, barley, quinoa, or brown rice. Corn or whole wheat flour tortillas. Vegetables Fresh or frozen vegetables (raw, steamed, roasted, or grilled).  Green salads. Fruits All fresh, canned (in natural juice), or frozen fruits. Meat and Other Protein Products Ground beef (85% or leaner), grass-fed beef, or beef trimmed of fat. Skinless chicken or [place]. Ground chicken or [place]. Pork trimmed of fat. All fish and seafood. Eggs. Dried beans, peas, or lentils. Unsalted nuts or seeds. Unsalted canned or dry beans. Dairy Low-fat dairy products, such as skim or 1% milk, 2% or reduced-fat cheeses, low-fat ricotta or  cottage cheese, or plain low-fat yogurt. Fats and Oils Tub margarines without trans fats. Light or reduced-fat mayonnaise and salad dressings. Avocado. Olive, canola, sesame, or safflower oils. Natural peanut or almond butter (choose ones without added sugar and oil). The items listed above may not be a complete list of recommended foods or beverages. Contact your dietitian for more options. WHAT FOODS ARE NOT RECOMMENDED? Grains White bread. White pasta. White rice. Cornbread. Bagels, pastries, and croissants. Crackers that contain trans fat. Vegetables White potatoes. Corn. Creamed or fried vegetables. Vegetables in a cheese sauce. Fruits Dried fruits. Canned fruit in light or heavy syrup. Fruit juice. Meat and Other Protein Products Fatty cuts of meat. Ribs, chicken wings, bacon, sausage, bologna, salami, chitterlings, fatback, hot dogs, bratwurst, and packaged luncheon meats. Liver and organ meats. Dairy Whole or 2% milk, cream, half-and-half, and cream cheese. Whole milk cheeses. Whole-fat or sweetened yogurt. Full-fat cheeses. Nondairy creamers and whipped toppings. Processed cheese, cheese spreads, or cheese curds. Sweets and Desserts Corn syrup, sugars, honey, and molasses. Candy. Jam and jelly. Syrup. Sweetened cereals. Cookies, pies, cakes, donuts, muffins, and ice cream. Fats and Oils Butter, stick margarine, lard, shortening, ghee, or bacon fat. Coconut, palm kernel, or palm oils. Beverages Alcohol. Sweetened drinks (such as sodas, lemonade, and fruit drinks or punches). The items listed above may not be a complete list of foods and beverages to avoid. Contact your dietitian for more information.   This information is not intended to replace advice given to you by your health care provider. Make sure you discuss any questions you have with your health care provider.   Document Released: 12/08/2005 Document Revised: 12/29/2014 Document Reviewed: 03/08/2014 Elsevier Interactive  Patient Education 2016 [place] on coverage for shingles vaccine and DEXA scan (bone density) and let us know if interested.  Consider Prevnar 13 vaccine after over current illness. Set up repeat mammogram.

## 2016-06-18 NOTE — Progress Notes (Signed)
Pre visit review using our clinic review tool, if applicable. No additional management support is needed unless otherwise documented below in the visit note. 

## 2016-06-18 NOTE — Progress Notes (Signed)
Subjective:    Patient ID: Heather Oneal, female    DOB: September 30, 1941, 75 y.o.   MRN: [id number]  HPI Patient seen for physical exam. She has history of obesity and some perennial and seasonal allergies. Takes no regular prescription medications. Recent respiratory illness. Went to emergency department and had normal chest x-ray June 13. No fever. Still has some lingering dry cough. Overall improving slowly. Nonsmoker.  Last mammogram over 2 years ago. No history of known bone density scan. No history of shingles vaccine. Has never had Prevnar 60. Colonoscopy up-to-date. Very sedentary. No regular exercise.  Past Medical History  Diagnosis Date  . Depression   . Allergy   . History of IBS   . Hyperlipidemia   . HYPERLIPIDEMIA 01/28/2010  . DEPRESSION 01/28/2010   Past Surgical History  Procedure Laterality Date  . Ovarian cyst surgery  1973    ruptured ovarian cyst  . Appendectomy  1973  . Cholecystectomy  2008  . Tonsillectomy  1948    reports that she has never smoked. She has never used smokeless tobacco. She reports that she drinks alcohol. She reports that she does not use illicit drugs. family history includes Cancer (age of onset: 54) in her sister; Crohn's disease in her brother; Diabetes in her maternal grandmother; Heart disease in her mother and paternal grandfather; Hypertension in her mother; Stomach cancer in her paternal grandmother. There is no history of Colon cancer, Pancreatic cancer, Rectal cancer, or Esophageal cancer. Allergies  Allergen Reactions  . Crestor [Rosuvastatin]     " Ears itching and throat burning"      Review of Systems  Constitutional: Negative for fever, activity change, appetite change, fatigue and unexpected weight change.  HENT: Positive for voice change (From recent respiratory illness for the past week or so). Negative for ear pain, hearing loss, sore throat and trouble swallowing.   Eyes: Negative for visual disturbance.    Respiratory: Positive for cough. Negative for shortness of breath.   Cardiovascular: Negative for chest pain and palpitations.  Gastrointestinal: Negative for abdominal pain, diarrhea, constipation and blood in stool.  Genitourinary: Negative for dysuria and hematuria.  Musculoskeletal: Negative for myalgias, back pain and arthralgias.  Skin: Negative for rash.  Neurological: Negative for dizziness, syncope and headaches.  Hematological: Negative for adenopathy.  Psychiatric/Behavioral: Negative for confusion and dysphoric mood.       Objective:   Physical Exam  Constitutional: She is oriented to person, place, and time. She appears well-developed and well-nourished.  HENT:  Head: Normocephalic and atraumatic.  Eyes: EOM are normal. Pupils are equal, round, and reactive to light.  Neck: Normal range of motion. Neck supple. No thyromegaly present.  Cardiovascular: Normal rate, regular rhythm and normal heart sounds.   No murmur heard. Pulmonary/Chest: Breath sounds normal. No respiratory distress. She has no wheezes. She has no rales.  Abdominal: Soft. Bowel sounds are normal. She exhibits no distension and no mass. There is no tenderness. There is no rebound and no guarding.  Genitourinary:  Patient plans to see gynecologist soon and defers  Musculoskeletal: Normal range of motion. She exhibits no edema.  Lymphadenopathy:    She has no cervical adenopathy.  Neurological: She is alert and oriented to person, place, and time. She displays normal reflexes. No cranial nerve deficit.  Skin: No rash noted.  Psychiatric: She has a normal mood and affect. Her behavior is normal. Judgment and thought content normal.          Assessment &  Plan:  Physical exam. Labs reviewed. She has dyslipidemia. We discussed diet and lifestyle factors to help control. We recommend Prevnar 13 but she declines because of recent illness. Check on coverage for shingles vaccine and DEXA scan. Set up repeat  mammogram. Strongly encouraged to lose some weight. 10 year risk of CAD 9.8%. We discussed pros and cons of statin use and she declines.  Eulas Post MD [facility]

## 2016-07-15 ENCOUNTER — Telehealth: Payer: Self-pay | Admitting: Family Medicine

## 2016-07-15 NOTE — Telephone Encounter (Signed)
Pt states she pulled a muscle/strained her back and would like dr Elease Hashimoto to call in a muscle relaxer  [facility]/[address]

## 2016-07-16 NOTE — Telephone Encounter (Signed)
Needs to be evaluated.

## 2016-07-18 ENCOUNTER — Ambulatory Visit (INDEPENDENT_AMBULATORY_CARE_PROVIDER_SITE_OTHER): Payer: PPO | Admitting: Adult Health

## 2016-07-18 ENCOUNTER — Encounter: Payer: Self-pay | Admitting: Adult Health

## 2016-07-18 VITALS — BP 110/70 | Temp 98.3°F | Ht 67.0 in | Wt 240.4 lb

## 2016-07-18 DIAGNOSIS — M545 Low back pain, unspecified: Secondary | ICD-10-CM

## 2016-07-18 MED ORDER — TIZANIDINE HCL 4 MG PO TABS: 4.0000 mg | ORAL_TABLET | Freq: Four times a day (QID) | ORAL | 0 refills | Status: DC | PRN

## 2016-07-18 MED ORDER — METHYLPREDNISOLONE ACETATE 80 MG/ML IJ SUSP
80.0000 mg | Freq: Once | INTRAMUSCULAR | Status: AC
  Administered 2016-07-18: 80 mg via INTRAMUSCULAR

## 2016-07-18 NOTE — Patient Instructions (Addendum)
It was great meeting you today.   I am sorry you are in so much pain.   Your exam is consistent with a muscle spasm. I have sent in a prescription for a muscle relaxer. Take this at night as it will make you sleepy.   Take Motrin 600mg  every 8 hours as needed Use a heating pad or ice.   Follow up if no improvement   Muscle Strain A muscle strain is an injury that occurs when a muscle is stretched beyond its normal length. Usually a small number of muscle fibers are torn when this happens. Muscle strain is rated in degrees. First-degree strains have the least amount of muscle fiber tearing and pain. Second-degree and third-degree strains have increasingly more tearing and pain.  Usually, recovery from muscle strain takes 1-2 weeks. Complete healing takes 5-6 weeks.  CAUSES  Muscle strain happens when a sudden, violent force placed on a muscle stretches it too far. This may occur with lifting, sports, or a fall.  RISK FACTORS Muscle strain is especially common in athletes.  SIGNS AND SYMPTOMS At the site of the muscle strain, there may be:  Pain.  Bruising.  Swelling.  Difficulty using the muscle due to pain or lack of normal function. DIAGNOSIS  Your health care provider will perform a physical exam and ask about your medical history. TREATMENT  Often, the best treatment for a muscle strain is resting, icing, and applying cold compresses to the injured area.  HOME CARE INSTRUCTIONS   Use the PRICE method of treatment to promote muscle healing during the first 2-3 days after your injury. The PRICE method involves:  Protecting the muscle from being injured again.  Restricting your activity and resting the injured body part.  Icing your injury. To do this, put ice in a plastic bag. Place a towel between your skin and the bag. Then, apply the ice and leave it on from 15-20 minutes each hour. After the third day, switch to moist heat packs.  Apply compression to the injured area  with a splint or elastic bandage. Be careful not to wrap it too tightly. This may interfere with blood circulation or increase swelling.  Elevate the injured body part above the level of your heart as often as you can.  Only take over-the-counter or prescription medicines for pain, discomfort, or fever as directed by your health care provider.  Warming up prior to exercise helps to prevent future muscle strains. SEEK MEDICAL CARE IF:   You have increasing pain or swelling in the injured area.  You have numbness, tingling, or a significant loss of strength in the injured area. MAKE SURE YOU:   Understand these instructions.  Will watch your condition.  Will get help right away if you are not doing well or get worse.   This information is not intended to replace advice given to you by your health care provider. Make sure you discuss any questions you have with your health care provider.   Document Released: 12/08/2005 Document Revised: 09/28/2013 Document Reviewed: 07/07/2013 Elsevier Interactive Patient Education [employer].

## 2016-07-18 NOTE — Progress Notes (Signed)
   Subjective:    Patient ID: Heather Oneal, female    DOB: 11-Mar-1941, 75 y.o.   MRN: [id number]  HPI  76 year old female who presents to the office today for the acute complaint of " I threw my back out." She reports that three days ago " I woke up and my lower back was in spasms" She was using Flexeril that she had from a previous injury. She is unsure if this helped. She reports that the pain has been getting worse.   The pain is described as " throbbing and spasms".   She has not been using any over the counter medications    Review of Systems  Constitutional: Positive for activity change.  Respiratory: Negative.   Cardiovascular: Negative.   Musculoskeletal: Positive for back pain and myalgias. Negative for gait problem, neck pain and neck stiffness.  Neurological: Negative.   All other systems reviewed and are negative.      Objective:   Physical Exam  Constitutional: She is oriented to person, place, and time. She appears well-developed and well-nourished. No distress.  Cardiovascular: Normal rate, regular rhythm, normal heart sounds and intact distal pulses.  Exam reveals no gallop and no friction rub.   No murmur heard. Pulmonary/Chest: Effort normal and breath sounds normal. No respiratory distress. She has no wheezes. She has no rales. She exhibits no tenderness.  Musculoskeletal: She exhibits tenderness. She exhibits no edema or deformity.  Pain with palpation to lower lumbar spine (bilateral)  into bilateral buttocks.   Neurological: She is alert and oriented to person, place, and time.  Skin: Skin is warm and dry. No rash noted. She is not diaphoretic. No erythema. No pallor.  Psychiatric: She has a normal mood and affect. Her behavior is normal. Judgment and thought content normal.  Nursing note and vitals reviewed.     Assessment & Plan:  1. Bilateral low back pain without sciatica - Appears to be a muscle strain.  - tiZANidine (ZANAFLEX) 4 MG tablet; Take  1 tablet (4 mg total) by mouth every 6 (six) hours as needed for muscle spasms.  Dispense: 30 tablet; Refill: 0 - methylPREDNISolone acetate (DEPO-MEDROL) injection 80 mg; Inject 1 mL (80 mg total) into the muscle once. - Ibuprofen 600mg  Q8H PRN - Heating pad - Follow up if no improvement  - Consider imaging at that point   Dorothyann Peng, NP

## 2016-08-01 NOTE — Telephone Encounter (Signed)
Pt was scheduled

## 2016-09-12 ENCOUNTER — Ambulatory Visit (INDEPENDENT_AMBULATORY_CARE_PROVIDER_SITE_OTHER): Payer: PPO | Admitting: Adult Health

## 2016-09-12 ENCOUNTER — Encounter: Payer: Self-pay | Admitting: Adult Health

## 2016-09-12 VITALS — BP 128/78 | Ht 67.0 in | Wt 242.8 lb

## 2016-09-12 DIAGNOSIS — R0602 Shortness of breath: Secondary | ICD-10-CM | POA: Diagnosis not present

## 2016-09-12 NOTE — Progress Notes (Signed)
Subjective:    Patient ID: Heather Oneal, female    DOB: [birth year], 75 y.o.   MRN: [id number]  HPI  75 year old female who presents to the office today for the acute complaint of shortness of breath that started 3-4 days ago. She reports that the shortness of breath is intermittent. The only pattern she can figure out is that on occasion it happens when she is thinking about family situations that make her stressed ( she did not want to go into these situations). She reports that it was worse today when she was at the grocery store and she had an intermittent episode that lasted " a few moments" where she felt like she could not breath. She does not remember thinking about anything during that time for feeling anxious. At other times she feels as though she cannot take a deep breath. She does report that it feels as though she has chest pain when this happens. The pain is midsternal and does not radiate.   She denies any fevers, n/v/d, headaches, blurred vision or dizziness.   She has not started any new medications  Additionally, she adds " right now is the best I have felt all week."   Review of Systems  Constitutional: Negative.   HENT: Negative.   Respiratory: Positive for shortness of breath.   Cardiovascular: Positive for chest pain and palpitations. Negative for leg swelling.  Neurological: Negative.   Psychiatric/Behavioral: The patient is nervous/anxious.   All other systems reviewed and are negative.  Past Medical History:  Diagnosis Date  . Allergy   . Depression   . DEPRESSION 01/28/2010  . History of IBS   . Hyperlipidemia   . HYPERLIPIDEMIA 01/28/2010    Social History   Social History  . Marital status: Married    Spouse name: N/A  . Number of children: N/A  . Years of education: N/A   Occupational History  . Not on file.   Social History Main Topics  . Smoking status: Never Smoker  . Smokeless tobacco: Never Used  . Alcohol use Yes     Comment:  rarely  . Drug use: No  . Sexual activity: Not on file   Other Topics Concern  . Not on file   Social History Narrative  . No narrative on file    Past Surgical History:  Procedure Laterality Date  . APPENDECTOMY  1973  . CHOLECYSTECTOMY  2008  . OVARIAN CYST SURGERY  1973   ruptured ovarian cyst  . TONSILLECTOMY  1948    Family History  Problem Relation Age of Onset  . Heart disease Mother   . Hypertension Mother   . Cancer Sister 39    breast  . Crohn's disease Brother   . Diabetes Maternal Grandmother   . Heart disease Paternal Grandfather   . Stomach cancer Paternal Grandmother   . Colon cancer Neg Hx   . Pancreatic cancer Neg Hx   . Rectal cancer Neg Hx   . Esophageal cancer Neg Hx     Allergies  Allergen Reactions  . Crestor [Rosuvastatin]     " Ears itching and throat burning"    Current Outpatient Prescriptions on File Prior to Visit  Medication Sig Dispense Refill  . cetirizine (ZYRTEC) 10 MG tablet Take 10 mg by mouth daily.    . fexofenadine (ALLEGRA) 180 MG tablet Take 180 mg by mouth daily. Reported on 06/18/2016    . fluticasone (FLONASE) 50 MCG/ACT nasal spray Place  1 spray into both nostrils daily. 16 g 2  . montelukast (SINGULAIR) 10 MG tablet Take 1 tablet (10 mg total) by mouth at bedtime. 30 tablet 2  . tiZANidine (ZANAFLEX) 4 MG tablet Take 1 tablet (4 mg total) by mouth every 6 (six) hours as needed for muscle spasms. 30 tablet 0   No current facility-administered medications on file prior to visit.     BP 128/78   Ht 5\' 7"  (1.702 m)   Wt 242 lb 12.8 oz (110.1 kg)   BMI 38.03 kg/m       Objective:   Physical Exam  Constitutional: She is oriented to person, place, and time. She appears well-developed and well-nourished. No distress.  HENT:  Head: Normocephalic and atraumatic.  Right Ear: External ear normal.  Left Ear: External ear normal.  Mouth/Throat: Oropharynx is clear and moist. No oropharyngeal exudate.  Eyes:  Conjunctivae and EOM are normal. Pupils are equal, round, and reactive to light. Right eye exhibits no discharge. Left eye exhibits no discharge. No scleral icterus.  Neck: Normal range of motion. Neck supple.  Cardiovascular: Normal rate, regular rhythm, normal heart sounds and intact distal pulses.  Exam reveals no gallop and no friction rub.   No murmur heard. Pulmonary/Chest: Effort normal and breath sounds normal. No respiratory distress. She has no wheezes. She has no rales. She exhibits no tenderness.  Lymphadenopathy:    She has no cervical adenopathy.  Neurological: She is alert and oriented to person, place, and time. She has normal reflexes. No cranial nerve deficit. She exhibits normal muscle tone. Coordination normal.  Skin: Skin is warm and dry. No rash noted. She is not diaphoretic. No erythema. No pallor.  Psychiatric: She has a normal mood and affect. Her behavior is normal. Judgment and thought content normal.  Nursing note and vitals reviewed.     Assessment & Plan:  1. Shortness of breath - Negative exam  - EKG 12-Lead- SR, rate 76 - Possibly some anxiety component - Unable to get chest xray due to it being after 5 pm.  - Advised that if she continues to have these symptoms over the weekend that she needs to be seen at [facility] or ER - Follow up with PCP as needed  Dorothyann Peng, NP

## 2017-01-19 ENCOUNTER — Ambulatory Visit (INDEPENDENT_AMBULATORY_CARE_PROVIDER_SITE_OTHER): Payer: PPO | Admitting: Family Medicine

## 2017-01-19 VITALS — BP 112/72 | HR 75 | Temp 98.2°F | Ht 67.0 in | Wt 245.0 lb

## 2017-01-19 DIAGNOSIS — F418 Other specified anxiety disorders: Secondary | ICD-10-CM

## 2017-01-19 NOTE — Patient Instructions (Signed)
[place]  [phone]

## 2017-01-19 NOTE — Progress Notes (Signed)
Subjective:     Patient ID: Heather Oneal, female   DOB: [birth year], 76 y.o.   MRN: [id number]  HPI Patient seen with anxiety symptoms. She's had some family stressors recently and thinks that may be trigger. She had some atypical chest symptoms a couple times and each time was during anxiety episode. She's had no exertional chest pain whatsoever. She has recently started seeing a minister at [employer] and is getting some counseling but she would like to consider some professional counseling outside of her church. She is engaged in a Bible study and feels that is helping her anxiety symptoms. She had a couple episodes where she felt like she maybe having a brief panic attack. No formal diagnosis of panic disorder. Sleep fairly well. Denies depression symptoms.  Past Medical History:  Diagnosis Date  . Allergy   . Depression   . DEPRESSION 01/28/2010  . History of IBS   . Hyperlipidemia   . HYPERLIPIDEMIA 01/28/2010   Past Surgical History:  Procedure Laterality Date  . APPENDECTOMY  1973  . CHOLECYSTECTOMY  2008  . OVARIAN CYST SURGERY  1973   ruptured ovarian cyst  . TONSILLECTOMY  1948    reports that she has never smoked. She has never used smokeless tobacco. She reports that she drinks alcohol. She reports that she does not use drugs. family history includes Cancer (age of onset: 38) in her sister; Crohn's disease in her brother; Diabetes in her maternal grandmother; Heart disease in her mother and paternal grandfather; Hypertension in her mother; Stomach cancer in her paternal grandmother. Allergies  Allergen Reactions  . Crestor [Rosuvastatin]     " Ears itching and throat burning"     Review of Systems  Constitutional: Negative for appetite change and unexpected weight change.  Respiratory: Negative for shortness of breath.   Psychiatric/Behavioral: Negative for dysphoric mood and suicidal ideas. The patient is nervous/anxious.        Objective:   Physical Exam   Constitutional: She appears well-developed and well-nourished.  Cardiovascular: Normal rate and regular rhythm.   Pulmonary/Chest: Effort normal and breath sounds normal. No respiratory distress. She has no wheezes. She has no rales.  Psychiatric: She has a normal mood and affect. Her behavior is normal. Judgment and thought content normal.       Assessment:     Situational anxiety.    Plan:     -We recommended counseling and have given her names of a couple of local counselors. -We discussed nonpharmacologic ways to help manage anxiety such as exercise, adequate sleep, good balanced diet, minimize caffeine intake -Consider possible SSRI if symptoms progress  Eulas Post MD [facility]

## 2017-01-19 NOTE — Progress Notes (Signed)
Pre visit review using our clinic review tool, if applicable. No additional management support is needed unless otherwise documented below in the visit note. 

## 2017-08-18 DIAGNOSIS — N760 Acute vaginitis: Secondary | ICD-10-CM | POA: Diagnosis not present

## 2017-08-18 DIAGNOSIS — L293 Anogenital pruritus, unspecified: Secondary | ICD-10-CM | POA: Diagnosis not present

## 2018-01-27 DIAGNOSIS — L293 Anogenital pruritus, unspecified: Secondary | ICD-10-CM | POA: Diagnosis not present

## 2018-01-27 DIAGNOSIS — N760 Acute vaginitis: Secondary | ICD-10-CM | POA: Diagnosis not present

## 2018-02-08 ENCOUNTER — Encounter: Payer: Self-pay | Admitting: Family Medicine

## 2018-02-08 ENCOUNTER — Ambulatory Visit (INDEPENDENT_AMBULATORY_CARE_PROVIDER_SITE_OTHER): Payer: PPO | Admitting: Family Medicine

## 2018-02-08 VITALS — BP 108/70 | HR 80 | Temp 98.4°F | Ht 66.0 in | Wt 242.5 lb

## 2018-02-08 DIAGNOSIS — Z23 Encounter for immunization: Secondary | ICD-10-CM | POA: Diagnosis not present

## 2018-02-08 DIAGNOSIS — Z780 Asymptomatic menopausal state: Secondary | ICD-10-CM | POA: Diagnosis not present

## 2018-02-08 DIAGNOSIS — Z0000 Encounter for general adult medical examination without abnormal findings: Secondary | ICD-10-CM | POA: Diagnosis not present

## 2018-02-08 LAB — CBC WITH DIFFERENTIAL/PLATELET
BASOS ABS: 0 10*3/uL (ref 0.0–0.1)
Basophils Relative: 0.4 % (ref 0.0–3.0)
Eosinophils Absolute: 0.1 10*3/uL (ref 0.0–0.7)
Eosinophils Relative: 1.2 % (ref 0.0–5.0)
HCT: 38.1 % (ref 36.0–46.0)
HEMOGLOBIN: 12.7 g/dL (ref 12.0–15.0)
LYMPHS ABS: 1.8 10*3/uL (ref 0.7–4.0)
Lymphocytes Relative: 24.8 % (ref 12.0–46.0)
MCHC: 33.2 g/dL (ref 30.0–36.0)
MCV: 88.1 fl (ref 78.0–100.0)
MONO ABS: 0.7 10*3/uL (ref 0.1–1.0)
Monocytes Relative: 9.6 % (ref 3.0–12.0)
Neutro Abs: 4.5 10*3/uL (ref 1.4–7.7)
Neutrophils Relative %: 64 % (ref 43.0–77.0)
Platelets: 362 10*3/uL (ref 150.0–400.0)
RBC: 4.33 Mil/uL (ref 3.87–5.11)
RDW: 15.4 % (ref 11.5–15.5)
WBC: 7.1 10*3/uL (ref 4.0–10.5)

## 2018-02-08 LAB — LIPID PANEL
CHOL/HDL RATIO: 5
Cholesterol: 221 mg/dL — ABNORMAL HIGH (ref 0–200)
HDL: 46.7 mg/dL (ref >39.00)
LDL CALC: 137 mg/dL — AB (ref 0–99)
NONHDL: 173.93
TRIGLYCERIDES: 186 mg/dL — AB (ref 0.0–149.0)
VLDL: 37.2 mg/dL (ref 0.0–40.0)

## 2018-02-08 LAB — BASIC METABOLIC PANEL
BUN: 11 mg/dL (ref 6–23)
CALCIUM: 9 mg/dL (ref 8.4–10.5)
CHLORIDE: 103 meq/L (ref 96–112)
CO2: 30 meq/L (ref 19–32)
CREATININE: 0.68 mg/dL (ref 0.40–1.20)
GFR: 89.33 mL/min (ref >60.00)
Glucose, Bld: 101 mg/dL — ABNORMAL HIGH (ref 70–99)
Potassium: 4.7 mEq/L (ref 3.5–5.1)
Sodium: 140 mEq/L (ref 135–145)

## 2018-02-08 LAB — HEPATIC FUNCTION PANEL
ALK PHOS: 61 U/L (ref 39–117)
ALT: 15 U/L (ref 0–35)
AST: 14 U/L (ref 0–37)
Albumin: 4 g/dL (ref 3.5–5.2)
BILIRUBIN DIRECT: 0.1 mg/dL (ref 0.0–0.3)
BILIRUBIN TOTAL: 0.6 mg/dL (ref 0.2–1.2)
Total Protein: 6.8 g/dL (ref 6.0–8.3)

## 2018-02-08 LAB — TSH: TSH: 2.07 u[IU]/mL (ref 0.35–4.50)

## 2018-02-08 NOTE — Addendum Note (Signed)
Addended by: Westley Hummer B on: 02/08/2018 11:33 AM   Modules accepted: Orders

## 2018-02-08 NOTE — Patient Instructions (Signed)
Set up DEXA scan at [place] Try to lose some weight Scale back on sugar and starch intake. Try to get 1,200 mg calcium per day and 1,000 IU Vit D per day. Try to establish more consistent exercise.   Health Maintenance for Postmenopausal Women Menopause is a normal process in which your reproductive ability comes to an end. This process happens gradually over a span of months to years, usually between the ages of 20 and 68. Menopause is complete when you have missed 12 consecutive menstrual periods. It is important to talk with your health care provider about some of the most common conditions that affect postmenopausal women, such as heart disease, cancer, and bone loss (osteoporosis). Adopting a healthy lifestyle and getting preventive care can help to promote your health and wellness. Those actions can also lower your chances of developing some of these common conditions. What should I know about menopause? During menopause, you may experience a number of symptoms, such as:  Moderate-to-severe hot flashes.  Night sweats.  Decrease in sex drive.  Mood swings.  Headaches.  Tiredness.  Irritability.  Memory problems.  Insomnia.  Choosing to treat or not to treat menopausal changes is an individual decision that you make with your health care provider. What should I know about hormone replacement therapy and supplements? Hormone therapy products are effective for treating symptoms that are associated with menopause, such as hot flashes and night sweats. Hormone replacement carries certain risks, especially as you become older. If you are thinking about using estrogen or estrogen with progestin treatments, discuss the benefits and risks with your health care provider. What should I know about heart disease and stroke? Heart disease, heart attack, and stroke become more likely as you age. This may be due, in part, to the hormonal changes that your body experiences during  menopause. These can affect how your body processes dietary fats, triglycerides, and cholesterol. Heart attack and stroke are both medical emergencies. There are many things that you can do to help prevent heart disease and stroke:  Have your blood pressure checked at least every 1-2 years. High blood pressure causes heart disease and increases the risk of stroke.  If you are 65-71 years old, ask your health care provider if you should take aspirin to prevent a heart attack or a stroke.  Do not use any tobacco products, including cigarettes, chewing tobacco, or electronic cigarettes. If you need help quitting, ask your health care provider.  It is important to eat a healthy diet and maintain a healthy weight. ? Be sure to include plenty of vegetables, fruits, low-fat dairy products, and lean protein. ? Avoid eating foods that are high in solid fats, added sugars, or salt (sodium).  Get regular exercise. This is one of the most important things that you can do for your health. ? Try to exercise for at least 150 minutes each week. The type of exercise that you do should increase your heart rate and make you sweat. This is known as moderate-intensity exercise. ? Try to do strengthening exercises at least twice each week. Do these in addition to the moderate-intensity exercise.  Know your numbers.Ask your health care provider to check your cholesterol and your blood glucose. Continue to have your blood tested as directed by your health care provider.  What should I know about cancer screening? There are several types of cancer. Take the following steps to reduce your risk and to catch any cancer development as early as possible. Breast  Cancer  Practice breast self-awareness. ? This means understanding how your breasts normally appear and feel. ? It also means doing regular breast self-exams. Let your health care provider know about any changes, no matter how small.  If you are 40 or older,  have a clinician do a breast exam (clinical breast exam or CBE) every year. Depending on your age, family history, and medical history, it may be recommended that you also have a yearly breast X-ray (mammogram).  If you have a family history of breast cancer, talk with your health care provider about genetic screening.  If you are at high risk for breast cancer, talk with your health care provider about having an MRI and a mammogram every year.  Breast cancer (BRCA) gene test is recommended for women who have family members with BRCA-related cancers. Results of the assessment will determine the need for genetic counseling and BRCA1 and for BRCA2 testing. BRCA-related cancers include these types: ? Breast. This occurs in males or females. ? Ovarian. ? Tubal. This may also be called fallopian tube cancer. ? Cancer of the abdominal or pelvic lining (peritoneal cancer). ? Prostate. ? Pancreatic.  Cervical, Uterine, and Ovarian Cancer Your health care provider may recommend that you be screened regularly for cancer of the pelvic organs. These include your ovaries, uterus, and vagina. This screening involves a pelvic exam, which includes checking for microscopic changes to the surface of your cervix (Pap test).  For women ages 21-65, health care providers may recommend a pelvic exam and a Pap test every three years. For women ages 62-65, they may recommend the Pap test and pelvic exam, combined with testing for human papilloma virus (HPV), every five years. Some types of HPV increase your risk of cervical cancer. Testing for HPV may also be done on women of any age who have unclear Pap test results.  Other health care providers may not recommend any screening for nonpregnant women who are considered low risk for pelvic cancer and have no symptoms. Ask your health care provider if a screening pelvic exam is right for you.  If you have had past treatment for cervical cancer or a condition that could  lead to cancer, you need Pap tests and screening for cancer for at least 20 years after your treatment. If Pap tests have been discontinued for you, your risk factors (such as having a new sexual partner) need to be reassessed to determine if you should start having screenings again. Some women have medical problems that increase the chance of getting cervical cancer. In these cases, your health care provider may recommend that you have screening and Pap tests more often.  If you have a family history of uterine cancer or ovarian cancer, talk with your health care provider about genetic screening.  If you have vaginal bleeding after reaching menopause, tell your health care provider.  There are currently no reliable tests available to screen for ovarian cancer.  Lung Cancer Lung cancer screening is recommended for adults 87-54 years old who are at high risk for lung cancer because of a history of smoking. A yearly low-dose CT scan of the lungs is recommended if you:  Currently smoke.  Have a history of at least 30 pack-years of smoking and you currently smoke or have quit within the past 15 years. A pack-year is smoking an average of one pack of cigarettes per day for one year.  Yearly screening should:  Continue until it has been 15 years since you  quit.  Stop if you develop a health problem that would prevent you from having lung cancer treatment.  Colorectal Cancer  This type of cancer can be detected and can often be prevented.  Routine colorectal cancer screening usually begins at age 41 and continues through age 36.  If you have risk factors for colon cancer, your health care provider may recommend that you be screened at an earlier age.  If you have a family history of colorectal cancer, talk with your health care provider about genetic screening.  Your health care provider may also recommend using home test kits to check for hidden blood in your stool.  A small camera at the  end of a tube can be used to examine your colon directly (sigmoidoscopy or colonoscopy). This is done to check for the earliest forms of colorectal cancer.  Direct examination of the colon should be repeated every 5-10 years until age 26. However, if early forms of precancerous polyps or small growths are found or if you have a family history or genetic risk for colorectal cancer, you may need to be screened more often.  Skin Cancer  Check your skin from head to toe regularly.  Monitor any moles. Be sure to tell your health care provider: ? About any new moles or changes in moles, especially if there is a change in a mole's shape or color. ? If you have a mole that is larger than the size of a pencil eraser.  If any of your family members has a history of skin cancer, especially at a young age, talk with your health care provider about genetic screening.  Always use sunscreen. Apply sunscreen liberally and repeatedly throughout the day.  Whenever you are outside, protect yourself by wearing long sleeves, pants, a wide-brimmed hat, and sunglasses.  What should I know about osteoporosis? Osteoporosis is a condition in which bone destruction happens more quickly than new bone creation. After menopause, you may be at an increased risk for osteoporosis. To help prevent osteoporosis or the bone fractures that can happen because of osteoporosis, the following is recommended:  If you are 38-49 years old, get at least 1,000 mg of calcium and at least 600 mg of vitamin D per day.  If you are older than age 39 but younger than age 15, get at least 1,200 mg of calcium and at least 600 mg of vitamin D per day.  If you are older than age 39, get at least 1,200 mg of calcium and at least 800 mg of vitamin D per day.  Smoking and excessive alcohol intake increase the risk of osteoporosis. Eat foods that are rich in calcium and vitamin D, and do weight-bearing exercises several times each week as directed  by your health care provider. What should I know about how menopause affects my mental health? Depression may occur at any age, but it is more common as you become older. Common symptoms of depression include:  Low or sad mood.  Changes in sleep patterns.  Changes in appetite or eating patterns.  Feeling an overall lack of motivation or enjoyment of activities that you previously enjoyed.  Frequent crying spells.  Talk with your health care provider if you think that you are experiencing depression. What should I know about immunizations? It is important that you get and maintain your immunizations. These include:  Tetanus, diphtheria, and pertussis (Tdap) booster vaccine.  Influenza every year before the flu season begins.  Pneumonia vaccine.  Shingles vaccine.  Your health care provider may also recommend other immunizations. This information is not intended to replace advice given to you by your health care provider. Make sure you discuss any questions you have with your health care provider. Document Released: 01/30/2006 Document Revised: 06/27/2016 Document Reviewed: 09/11/2015 Elsevier Interactive Patient Education  2018 [employer].

## 2018-02-08 NOTE — Progress Notes (Signed)
Subjective:     Patient ID: Heather Oneal, female   DOB: 02-Jan-1941, 77 y.o.   MRN: [id number]  HPI Patient seen for physical exam. Her chronic problems include history of allergic rhinitis, irritable bowel syndrome, obesity, hyperlipidemia. She has been dealing with a lot of stress past couple of years with her husband's declining health. He was recently diagnosed with Parkinson's disease.  Patient still sees gynecologist and plans to see them this summer and plans to get follow-up mammogram then. Repeat colonoscopy due July 2020.  No history of Prevnar 13. She's not had flu vaccine and declines. Has not had recent bone density scan.  Denies any recent falls. Poor compliance with diet. Eats ice cream frequently several days per week. No history of smoking. No alcohol use.  Past Medical History:  Diagnosis Date  . Allergy   . Depression   . DEPRESSION 01/28/2010  . History of IBS   . Hyperlipidemia   . HYPERLIPIDEMIA 01/28/2010   Past Surgical History:  Procedure Laterality Date  . APPENDECTOMY  1973  . CHOLECYSTECTOMY  2008  . OVARIAN CYST SURGERY  1973   ruptured ovarian cyst  . TONSILLECTOMY  1948    reports that  has never smoked. she has never used smokeless tobacco. She reports that she drinks alcohol. She reports that she does not use drugs. family history includes Cancer (age of onset: 6) in her sister; Crohn's disease in her brother; Diabetes in her maternal grandmother; Heart disease in her mother and paternal grandfather; Hypertension in her mother; Stomach cancer in her paternal grandmother. Allergies  Allergen Reactions  . Crestor [Rosuvastatin]     " Ears itching and throat burning"     Review of Systems  Constitutional: Negative for activity change, appetite change, fatigue, fever and unexpected weight change.  HENT: Positive for hearing loss (she's had some subjective hearing loss in recent years. Never formally tested). Negative for ear pain, sore throat and  trouble swallowing.   Eyes: Negative for visual disturbance.  Respiratory: Negative for cough and shortness of breath.   Cardiovascular: Negative for chest pain and palpitations.  Gastrointestinal: Negative for abdominal pain, blood in stool, constipation and diarrhea.  Endocrine: Negative for polydipsia and polyuria.  Genitourinary: Negative for dysuria and hematuria.  Musculoskeletal: Positive for arthralgias. Negative for back pain and myalgias.  Skin: Negative for rash.  Neurological: Negative for dizziness, tremors, syncope, weakness and headaches.  Hematological: Negative for adenopathy.  Psychiatric/Behavioral: Negative for confusion and dysphoric mood.       Objective:   Physical Exam  Constitutional: She is oriented to person, place, and time. She appears well-developed and well-nourished.  HENT:  Head: Normocephalic and atraumatic.  Eyes: EOM are normal. Pupils are equal, round, and reactive to light.  Neck: Normal range of motion. Neck supple. No thyromegaly present.  Cardiovascular: Normal rate, regular rhythm and normal heart sounds.  No murmur heard. Pulmonary/Chest: Breath sounds normal. No respiratory distress. She has no wheezes. She has no rales.  Abdominal: Soft. Bowel sounds are normal. She exhibits no distension and no mass. There is no tenderness. There is no rebound and no guarding.  Genitourinary:  Genitourinary Comments: Per GYN  Musculoskeletal: Normal range of motion. She exhibits no edema.  Lymphadenopathy:    She has no cervical adenopathy.  Neurological: She is alert and oriented to person, place, and time. She displays normal reflexes. No cranial nerve deficit.  Skin: No rash noted.  Psychiatric: She has a normal mood and affect.  Her behavior is normal. Judgment and thought content normal.       Assessment:     Physical exam. Several issues addressed as below.    Plan:     -Strongly encouraged to lose some weight. We discussed strategies with  increasing exercise and scaling back sugars and starches especially -Repeat colonoscopy by 2020 -Continue mammograms through GYN -Prevnar 13 given -Recommended flu vaccine and patient declines -Schedule follow-up DEXA scan Discussed importance of daily calcium 1200 mg and vitamin D thousand international units along with regular weightbearing exercise -Set up follow-up lab work   Eulas Post MD [facility]

## 2018-05-25 DIAGNOSIS — Z1389 Encounter for screening for other disorder: Secondary | ICD-10-CM | POA: Diagnosis not present

## 2018-05-25 DIAGNOSIS — Z6838 Body mass index (BMI) 38.0-38.9, adult: Secondary | ICD-10-CM | POA: Diagnosis not present

## 2018-05-25 DIAGNOSIS — Z1231 Encounter for screening mammogram for malignant neoplasm of breast: Secondary | ICD-10-CM | POA: Diagnosis not present

## 2018-05-25 DIAGNOSIS — Z130 Encounter for screening for diseases of the blood and blood-forming organs and certain disorders involving the immune mechanism: Secondary | ICD-10-CM | POA: Diagnosis not present

## 2018-05-25 DIAGNOSIS — Z01419 Encounter for gynecological examination (general) (routine) without abnormal findings: Secondary | ICD-10-CM | POA: Diagnosis not present

## 2018-05-25 DIAGNOSIS — Z124 Encounter for screening for malignant neoplasm of cervix: Secondary | ICD-10-CM | POA: Diagnosis not present

## 2019-03-02 DIAGNOSIS — M9903 Segmental and somatic dysfunction of lumbar region: Secondary | ICD-10-CM | POA: Diagnosis not present

## 2019-03-02 DIAGNOSIS — M9904 Segmental and somatic dysfunction of sacral region: Secondary | ICD-10-CM | POA: Diagnosis not present

## 2019-03-02 DIAGNOSIS — M5030 Other cervical disc degeneration, unspecified cervical region: Secondary | ICD-10-CM | POA: Diagnosis not present

## 2019-03-02 DIAGNOSIS — M9901 Segmental and somatic dysfunction of cervical region: Secondary | ICD-10-CM | POA: Diagnosis not present

## 2019-03-02 DIAGNOSIS — M5136 Other intervertebral disc degeneration, lumbar region: Secondary | ICD-10-CM | POA: Diagnosis not present

## 2019-03-02 DIAGNOSIS — M9902 Segmental and somatic dysfunction of thoracic region: Secondary | ICD-10-CM | POA: Diagnosis not present

## 2019-03-07 DIAGNOSIS — M9901 Segmental and somatic dysfunction of cervical region: Secondary | ICD-10-CM | POA: Diagnosis not present

## 2019-03-07 DIAGNOSIS — M5030 Other cervical disc degeneration, unspecified cervical region: Secondary | ICD-10-CM | POA: Diagnosis not present

## 2019-03-07 DIAGNOSIS — M5136 Other intervertebral disc degeneration, lumbar region: Secondary | ICD-10-CM | POA: Diagnosis not present

## 2019-03-07 DIAGNOSIS — M9904 Segmental and somatic dysfunction of sacral region: Secondary | ICD-10-CM | POA: Diagnosis not present

## 2019-03-07 DIAGNOSIS — M9903 Segmental and somatic dysfunction of lumbar region: Secondary | ICD-10-CM | POA: Diagnosis not present

## 2019-03-07 DIAGNOSIS — M9902 Segmental and somatic dysfunction of thoracic region: Secondary | ICD-10-CM | POA: Diagnosis not present

## 2019-03-14 DIAGNOSIS — M5030 Other cervical disc degeneration, unspecified cervical region: Secondary | ICD-10-CM | POA: Diagnosis not present

## 2019-03-14 DIAGNOSIS — M9901 Segmental and somatic dysfunction of cervical region: Secondary | ICD-10-CM | POA: Diagnosis not present

## 2019-03-14 DIAGNOSIS — M5136 Other intervertebral disc degeneration, lumbar region: Secondary | ICD-10-CM | POA: Diagnosis not present

## 2019-03-14 DIAGNOSIS — M9903 Segmental and somatic dysfunction of lumbar region: Secondary | ICD-10-CM | POA: Diagnosis not present

## 2019-03-14 DIAGNOSIS — M9904 Segmental and somatic dysfunction of sacral region: Secondary | ICD-10-CM | POA: Diagnosis not present

## 2019-03-14 DIAGNOSIS — M9902 Segmental and somatic dysfunction of thoracic region: Secondary | ICD-10-CM | POA: Diagnosis not present

## 2019-03-21 DIAGNOSIS — M9901 Segmental and somatic dysfunction of cervical region: Secondary | ICD-10-CM | POA: Diagnosis not present

## 2019-03-21 DIAGNOSIS — M9902 Segmental and somatic dysfunction of thoracic region: Secondary | ICD-10-CM | POA: Diagnosis not present

## 2019-03-21 DIAGNOSIS — M5030 Other cervical disc degeneration, unspecified cervical region: Secondary | ICD-10-CM | POA: Diagnosis not present

## 2019-03-21 DIAGNOSIS — M9904 Segmental and somatic dysfunction of sacral region: Secondary | ICD-10-CM | POA: Diagnosis not present

## 2019-03-21 DIAGNOSIS — M5136 Other intervertebral disc degeneration, lumbar region: Secondary | ICD-10-CM | POA: Diagnosis not present

## 2019-03-21 DIAGNOSIS — M9903 Segmental and somatic dysfunction of lumbar region: Secondary | ICD-10-CM | POA: Diagnosis not present

## 2019-03-23 ENCOUNTER — Ambulatory Visit: Payer: Self-pay | Admitting: *Deleted

## 2019-03-23 ENCOUNTER — Ambulatory Visit (INDEPENDENT_AMBULATORY_CARE_PROVIDER_SITE_OTHER): Payer: PPO | Admitting: Family Medicine

## 2019-03-23 ENCOUNTER — Other Ambulatory Visit: Payer: Self-pay

## 2019-03-23 DIAGNOSIS — M79672 Pain in left foot: Secondary | ICD-10-CM

## 2019-03-23 NOTE — Telephone Encounter (Signed)
Message from Berneta Levins sent at 03/23/2019 11:21 AM EDT   Summary: foot swelling after fall   Pt called and left message on [place] 03/21/2019. Pt states that she feel last week and her left foot is very swollen and feels very strange.          Returned call to pt regarding a swollen left foot. She fell on March 30 th and now her foot is swollen. She fell on her knees, the left more than the right one. She has soreness to the left knee. Her left foot does not hurt. She is able to walk without pain. She has been using ice to that foot Would like to make an appointment for review. Advised pt that the office is doing virtual and telephone visits. Pt voice understanding. Flow, [place] notified at [employer] at [place] and the call was conference in.

## 2019-03-23 NOTE — Telephone Encounter (Signed)
Please advise if WebEx/Telephone and decide then if patient needs an xray?

## 2019-03-23 NOTE — Telephone Encounter (Signed)
Agree with Webex first.

## 2019-03-23 NOTE — Telephone Encounter (Signed)
  Reason for Disposition . Large swelling or bruise (> 2 inches or 5 cm)  Answer Assessment - Initial Assessment Questions 1. MECHANISM: "How did the injury happen?" (e.g., twisting injury, direct blow)      Fell on the 30 th 2. ONSET: "When did the injury happen?" (Minutes or hours ago)      Monday 3. LOCATION: "Where is the injury located?"      Left foot 4. APPEARANCE of INJURY: "What does the injury look like?"      swollen 5. WEIGHT-BEARING: "Can you put weight on that foot?" "Can you walk (four steps or more)?"       yes 6. SIZE: For cuts, bruises, or swelling, ask: "How large is it?" (e.g., inches or centimeters;  entire joint)      A lot bigger than the right one 7. PAIN: "Is there pain?" If so, ask: "How bad is the pain?"    (e.g., Scale 1-10; or mild, moderate, severe)     no 8. TETANUS: For any breaks in the skin, ask: "When was the last tetanus booster?"     No breaks 9. OTHER SYMPTOMS: "Do you have any other symptoms?"      no  Protocols used: FOOT AND ANKLE INJURY-A-AH

## 2019-03-23 NOTE — Telephone Encounter (Signed)
Called patient and instructed her how to use the iphone for her WebEx appointment. Patient verbalized an understanding.

## 2019-03-23 NOTE — Progress Notes (Signed)
Patient ID: Heather Oneal, female   DOB: 01-04-1941, 78 y.o.   MRN: [id number]  Virtual Visit via Video Note  I connected with [name] on 03/23/19 at  1:45 PM EDT by a video enabled telemedicine application and verified that I am speaking with the correct person using two identifiers.  Location patient: home Location provider:work or home office Persons participating in the virtual visit: patient, provider  I discussed the limitations of evaluation and management by telemedicine and the availability of in person appointments. The patient expressed understanding and agreed to proceed.   HPI: Patient states she fell last week.  She apparently landed on both knees.  She has not had any difficulty ambulating.  She has had some soreness in both knees but was concerned because Sunday she noticed some swelling of the left foot.  Denies ecchymosis.  No erythema.  She has taken some Advil and using some ice.  No difficulty with weightbearing.  She actually went to chiropractor appointment on Monday.  They apparently looked at her foot.  No x-rays were done.  She denies any calf edema or calf or leg pain   ROS: See pertinent positives and negatives per HPI.  Past Medical History:  Diagnosis Date  . Allergy   . Depression   . DEPRESSION 01/28/2010  . History of IBS   . Hyperlipidemia   . HYPERLIPIDEMIA 01/28/2010    Past Surgical History:  Procedure Laterality Date  . APPENDECTOMY  1973  . CHOLECYSTECTOMY  2008  . OVARIAN CYST SURGERY  1973   ruptured ovarian cyst  . TONSILLECTOMY  1948    Family History  Problem Relation Age of Onset  . Heart disease Mother   . Hypertension Mother   . Cancer Sister 57       breast  . Crohn's disease Brother   . Diabetes Maternal Grandmother   . Heart disease Paternal Grandfather   . Stomach cancer Paternal Grandmother   . Colon cancer Neg Hx   . Pancreatic cancer Neg Hx   . Rectal cancer Neg Hx   . Esophageal cancer Neg Hx     SOCIAL  HX: Lives with her husband.  Non-smoker.   Current Outpatient Medications:  .  cetirizine (ZYRTEC) 10 MG tablet, Take 10 mg by mouth daily., Disp: , Rfl:  .  fexofenadine (ALLEGRA) 180 MG tablet, Take 180 mg by mouth daily. Reported on 06/18/2016, Disp: , Rfl:  .  fluticasone (FLONASE) 50 MCG/ACT nasal spray, Place 1 spray into both nostrils daily., Disp: 16 g, Rfl: 2 .  montelukast (SINGULAIR) 10 MG tablet, Take 1 tablet (10 mg total) by mouth at bedtime., Disp: 30 tablet, Rfl: 2 .  tiZANidine (ZANAFLEX) 4 MG tablet, Take 1 tablet (4 mg total) by mouth every 6 (six) hours as needed for muscle spasms., Disp: 30 tablet, Rfl: 0  EXAM:  VITALS per patient if applicable:  GENERAL: alert, oriented, appears well and in no acute distress  HEENT: atraumatic, conjunttiva clear, no obvious abnormalities on inspection of external nose and ears  NECK: normal movements of the head and neck  LUNGS: on inspection no signs of respiratory distress, breathing rate appears normal, no obvious gross SOB, gasping or wheezing  CV: no obvious cyanosis  MS: moves all visible extremities without noticeable abnormality  PSYCH/NEURO: pleasant and cooperative, no obvious depression or anxiety, speech and thought processing grossly intact  ASSESSMENT AND PLAN:  Discussed the following assessment and plan:  Left foot pain-question dependent edema  related to ankle injury.  She has had no difficulty with weightbearing and given the fact that she has no pain- low clinical suspicion for fracture  -Given current COVID-19 pandemic we recommended no x-rays at this point since she is having no difficulties with weightbearing. -Continue with elevation and icing. -Touch base as needed if she has any persistent or increased swelling     I discussed the assessment and treatment plan with the patient. The patient was provided an opportunity to ask questions and all were answered. The patient agreed with the plan and  demonstrated an understanding of the instructions.   The patient was advised to call back or seek an in-person evaluation if the symptoms worsen or if the condition fails to improve as anticipated.  Carolann Littler, MD

## 2019-04-08 ENCOUNTER — Ambulatory Visit (INDEPENDENT_AMBULATORY_CARE_PROVIDER_SITE_OTHER): Payer: PPO | Admitting: Family Medicine

## 2019-04-08 ENCOUNTER — Other Ambulatory Visit: Payer: Self-pay

## 2019-04-08 DIAGNOSIS — Z0000 Encounter for general adult medical examination without abnormal findings: Secondary | ICD-10-CM

## 2019-04-08 NOTE — Progress Notes (Signed)
Patient ID: Heather Oneal, female   DOB: [birth year], 78 y.o.   MRN: [id number]  Virtual Visit via Video Note  I connected with Heather Oneal on 04/08/19 at  3:00 PM EDT by a video enabled telemedicine application and verified that I am speaking with the correct person using two identifiers.  Location patient: home Location provider:work or home office Persons participating in the virtual visit: patient, provider  I discussed the limitations of evaluation and management by telemedicine and the availability of in person appointments. The patient expressed understanding and agreed to proceed.   HPI: Medicare subsequent annual wellness visit.  Patient has history of hyperlipidemia, prediabetes, kidney stones, osteoarthritis  1.  Risk factors based on Past Medical , Social, and Family history reviewed and as indicated above with no changes  2.  Limitations in physical activities None.  No recent falls.  No formal exercise.  Fair balance.  3.  Depression/mood No active depression or anxiety issues PHQ 2 equals 0  4.  Hearing No defiits  5.  ADLs independent in all.  6.  Cognitive function (orientation to time and place, language, writing, speech,memory) no short or long term memory issues.  Language and judgement intact.  7.  Home Safety no issues no recent falls.  She feels fairly steady on her feet.  Does have a couple of rugs.  8.  Height, weight, and visual acuity.all stable.  9.  Counseling discussed -fall prevention.  Discussed Advanced Directives.  She currently has none but will look into this.  10. Recommendation of preventive services. Needs follow up DEXA scan.  11. Labs based on risk factors-none indicated at this time.  12. Care Plan- discussed with patient.    13. Other Providers  GI- Dr Scarlette Shorts,  GYN  14. Written schedule of screening/prevention services given to patient. -Influenza- yearly -pneumonia vaccines- completed -tetanus- due 11-24-23 -colonoscopy-  due 07-20-19 -DEXA- due now. -discussed Shingrix vaccine and she will check with pharmacy.    ROS: See pertinent positives and negatives per HPI.  Past Medical History:  Diagnosis Date  . Allergy   . Depression   . DEPRESSION 01/28/2010  . History of IBS   . Hyperlipidemia   . HYPERLIPIDEMIA 01/28/2010    Past Surgical History:  Procedure Laterality Date  . APPENDECTOMY  1973  . CHOLECYSTECTOMY  2008  . OVARIAN CYST SURGERY  1973   ruptured ovarian cyst  . TONSILLECTOMY  1948    Family History  Problem Relation Age of Onset  . Heart disease Mother   . Hypertension Mother   . Cancer Sister 75       breast  . Crohn's disease Brother   . Diabetes Maternal Grandmother   . Heart disease Paternal Grandfather   . Stomach cancer Paternal Grandmother   . Colon cancer Neg Hx   . Pancreatic cancer Neg Hx   . Rectal cancer Neg Hx   . Esophageal cancer Neg Hx     SOCIAL HX: non-smoker.  Married.  Husband has some health issues.  No ETOH.     Current Outpatient Medications:  .  cetirizine (ZYRTEC) 10 MG tablet, Take 10 mg by mouth daily., Disp: , Rfl:  .  fexofenadine (ALLEGRA) 180 MG tablet, Take 180 mg by mouth daily. Reported on 06/18/2016, Disp: , Rfl:  .  fluticasone (FLONASE) 50 MCG/ACT nasal spray, Place 1 spray into both nostrils daily., Disp: 16 g, Rfl: 2 .  montelukast (SINGULAIR) 10 MG tablet, Take 1 tablet (  10 mg total) by mouth at bedtime., Disp: 30 tablet, Rfl: 2 .  tiZANidine (ZANAFLEX) 4 MG tablet, Take 1 tablet (4 mg total) by mouth every 6 (six) hours as needed for muscle spasms., Disp: 30 tablet, Rfl: 0  EXAM:  VITALS per patient if applicable:  GENERAL: alert, oriented, appears well and in no acute distress  HEENT: atraumatic, conjunttiva clear, no obvious abnormalities on inspection of external nose and ears  NECK: normal movements of the head and neck  LUNGS: on inspection no signs of respiratory distress, breathing rate appears normal, no obvious  gross SOB, gasping or wheezing  CV: no obvious cyanosis  MS: moves all visible extremities without noticeable abnormality  PSYCH/NEURO: pleasant and cooperative, no obvious depression or anxiety, speech and thought processing grossly intact  ASSESSMENT AND PLAN:  Discussed the following assessment and plan:  Medicare subsequent annual wellness visit.  We discussed the following  -Advanced directives.  We answered questions.  She knows she needs to look into this -Recommend repeat mammogram which she will get through GYN -Discussed fall prevention -Consider DEXA scan which she has not had in several years     I discussed the assessment and treatment plan with the patient. The patient was provided an opportunity to ask questions and all were answered. The patient agreed with the plan and demonstrated an understanding of the instructions.   The patient was advised to call back or seek an in-person evaluation if the symptoms worsen or if the condition fails to improve as anticipated   Carolann Littler, MD

## 2019-06-20 ENCOUNTER — Encounter: Payer: Self-pay | Admitting: Internal Medicine

## 2019-06-29 ENCOUNTER — Encounter: Payer: Self-pay | Admitting: Internal Medicine

## 2019-07-11 ENCOUNTER — Ambulatory Visit (AMBULATORY_SURGERY_CENTER): Payer: Self-pay | Admitting: *Deleted

## 2019-07-11 ENCOUNTER — Other Ambulatory Visit: Payer: Self-pay

## 2019-07-11 ENCOUNTER — Telehealth: Payer: Self-pay | Admitting: *Deleted

## 2019-07-11 VITALS — Ht 68.0 in | Wt 245.0 lb

## 2019-07-11 DIAGNOSIS — Z86010 Personal history of colonic polyps: Secondary | ICD-10-CM

## 2019-07-11 NOTE — Telephone Encounter (Signed)
Called pt back and pre-visit completed.

## 2019-07-11 NOTE — Progress Notes (Signed)

## 2019-07-11 NOTE — Telephone Encounter (Signed)
Called to do pre-visit message left for pt. To call to reschedule pre-visit or procedure will be cancelled for 07/26/19 # provided for return call.

## 2019-07-19 ENCOUNTER — Encounter: Payer: Self-pay | Admitting: Family Medicine

## 2019-07-19 ENCOUNTER — Other Ambulatory Visit: Payer: Self-pay

## 2019-07-19 ENCOUNTER — Ambulatory Visit (INDEPENDENT_AMBULATORY_CARE_PROVIDER_SITE_OTHER): Payer: PPO | Admitting: Family Medicine

## 2019-07-19 DIAGNOSIS — M25472 Effusion, left ankle: Secondary | ICD-10-CM | POA: Diagnosis not present

## 2019-07-19 DIAGNOSIS — R350 Frequency of micturition: Secondary | ICD-10-CM | POA: Diagnosis not present

## 2019-07-19 DIAGNOSIS — R5383 Other fatigue: Secondary | ICD-10-CM | POA: Diagnosis not present

## 2019-07-19 NOTE — Progress Notes (Signed)
Patient ID: Heather Oneal, female   DOB: Oct 13, 1941, 78 y.o.   MRN: [id number]  This visit type was conducted due to national recommendations for restrictions regarding the COVID-19 pandemic in an effort to limit this patient's exposure and mitigate transmission in our community.   Virtual Visit via Video Note  I connected with Heather Oneal on 07/19/19 at 11:30 AM EDT by a video enabled telemedicine application and verified that I am speaking with the correct person using two identifiers.  Location patient: home Location provider:work or home office Persons participating in the virtual visit: patient, provider  I discussed the limitations of evaluation and management by telemedicine and the availability of in person appointments. The patient expressed understanding and agreed to proceed.   HPI: Patient has chronic problems including hyperlipidemia, obesity, history of depression, irritable bowel syndrome.  She had called with concerns that she has had some left foot and ankle swelling.  She has some edema of the right but somewhat asymmetric.  Has been present for several days if not weeks.  Edema worse late in the day.  She does have some dyspnea last week but no chest pain.  No cough.  No fever.  General fatigue issues.  She has also had some frequent urination which has been going on for months but no burning with urination.   ROS: See pertinent positives and negatives per HPI.  Past Medical History:  Diagnosis Date  . Allergy    seasonal  . Anemia    as a teenager  . Depression   . DEPRESSION 01/28/2010  . History of IBS   . Hyperlipidemia   . HYPERLIPIDEMIA 01/28/2010    Past Surgical History:  Procedure Laterality Date  . APPENDECTOMY  1973  . CHOLECYSTECTOMY  2008  . COLONOSCOPY    . OVARIAN CYST SURGERY  1973   ruptured ovarian cyst  . POLYPECTOMY    . TONSILLECTOMY  1948    Family History  Problem Relation Age of Onset  . Heart disease Mother   . Hypertension  Mother   . Cancer Sister 94       breast  . Crohn's disease Brother   . Diabetes Maternal Grandmother   . Heart disease Paternal Grandfather   . Stomach cancer Paternal Grandmother   . Colon cancer Neg Hx   . Pancreatic cancer Neg Hx   . Rectal cancer Neg Hx   . Esophageal cancer Neg Hx   . Colon polyps Neg Hx     SOCIAL HX: Non-smoker   Current Outpatient Medications:  .  guaiFENesin (MUCINEX PO), Take by mouth., Disp: , Rfl:  .  cetirizine (ZYRTEC) 10 MG tablet, Take 10 mg by mouth daily., Disp: , Rfl:  .  fexofenadine (ALLEGRA) 180 MG tablet, Take 180 mg by mouth daily. Reported on 06/18/2016, Disp: , Rfl:  .  fluticasone (FLONASE) 50 MCG/ACT nasal spray, Place 1 spray into both nostrils daily. (Patient not taking: Reported on 07/11/2019), Disp: 16 g, Rfl: 2 .  montelukast (SINGULAIR) 10 MG tablet, Take 1 tablet (10 mg total) by mouth at bedtime. (Patient not taking: Reported on 07/11/2019), Disp: 30 tablet, Rfl: 2 .  tiZANidine (ZANAFLEX) 4 MG tablet, Take 1 tablet (4 mg total) by mouth every 6 (six) hours as needed for muscle spasms. (Patient not taking: Reported on 07/11/2019), Disp: 30 tablet, Rfl: 0  EXAM:  VITALS per patient if applicable:  GENERAL: alert, oriented, appears well and in no acute distress  HEENT: atraumatic, conjunttiva  clear, no obvious abnormalities on inspection of external nose and ears  NECK: normal movements of the head and neck  LUNGS: on inspection no signs of respiratory distress, breathing rate appears normal, no obvious gross SOB, gasping or wheezing  CV: no obvious cyanosis  MS: moves all visible extremities without noticeable abnormality  PSYCH/NEURO: pleasant and cooperative, no obvious depression or anxiety, speech and thought processing grossly intact  ASSESSMENT AND PLAN:  Discussed the following assessment and plan:  #1 left foot and ankle edema greater than right.  -Needs further evaluation and will bring in office tomorrow to  more adequately assess  #2 chronic urine frequency.  Needs further assessment  #3 general fatigue issues. -Bring in tomorrow and obtain some screening labs and further evaluation     I discussed the assessment and treatment plan with the patient. The patient was provided an opportunity to ask questions and all were answered. The patient agreed with the plan and demonstrated an understanding of the instructions.   The patient was advised to call back or seek an in-person evaluation if the symptoms worsen or if the condition fails to improve as anticipated.     Carolann Littler, MD

## 2019-07-20 ENCOUNTER — Other Ambulatory Visit: Payer: Self-pay

## 2019-07-20 ENCOUNTER — Encounter: Payer: Self-pay | Admitting: Family Medicine

## 2019-07-20 ENCOUNTER — Ambulatory Visit (INDEPENDENT_AMBULATORY_CARE_PROVIDER_SITE_OTHER): Payer: PPO | Admitting: Family Medicine

## 2019-07-20 VITALS — BP 136/78 | HR 78 | Temp 98.0°F | Wt 253.6 lb

## 2019-07-20 DIAGNOSIS — M25473 Effusion, unspecified ankle: Secondary | ICD-10-CM

## 2019-07-20 DIAGNOSIS — R350 Frequency of micturition: Secondary | ICD-10-CM | POA: Diagnosis not present

## 2019-07-20 DIAGNOSIS — R5383 Other fatigue: Secondary | ICD-10-CM | POA: Diagnosis not present

## 2019-07-20 DIAGNOSIS — I878 Other specified disorders of veins: Secondary | ICD-10-CM | POA: Diagnosis not present

## 2019-07-20 LAB — BASIC METABOLIC PANEL
BUN: 15 mg/dL (ref 6–23)
CO2: 29 mEq/L (ref 19–32)
Calcium: 9.3 mg/dL (ref 8.4–10.5)
Chloride: 102 mEq/L (ref 96–112)
Creatinine, Ser: 0.75 mg/dL (ref 0.40–1.20)
GFR: 74.78 mL/min (ref >60.00)
Glucose, Bld: 93 mg/dL (ref 70–99)
Potassium: 4.6 mEq/L (ref 3.5–5.1)
Sodium: 139 mEq/L (ref 135–145)

## 2019-07-20 LAB — CBC WITH DIFFERENTIAL/PLATELET
Basophils Absolute: 0 10*3/uL (ref 0.0–0.1)
Basophils Relative: 0.6 % (ref 0.0–3.0)
Eosinophils Absolute: 0.2 10*3/uL (ref 0.0–0.7)
Eosinophils Relative: 2.9 % (ref 0.0–5.0)
HCT: 40.1 % (ref 36.0–46.0)
Hemoglobin: 13 g/dL (ref 12.0–15.0)
Lymphocytes Relative: 29.3 % (ref 12.0–46.0)
Lymphs Abs: 2 10*3/uL (ref 0.7–4.0)
MCHC: 32.4 g/dL (ref 30.0–36.0)
MCV: 88.6 fl (ref 78.0–100.0)
Monocytes Absolute: 0.6 10*3/uL (ref 0.1–1.0)
Monocytes Relative: 9.1 % (ref 3.0–12.0)
Neutro Abs: 4 10*3/uL (ref 1.4–7.7)
Neutrophils Relative %: 58.1 % (ref 43.0–77.0)
Platelets: 339 10*3/uL (ref 150.0–400.0)
RBC: 4.53 Mil/uL (ref 3.87–5.11)
RDW: 15.2 % (ref 11.5–15.5)
WBC: 6.9 10*3/uL (ref 4.0–10.5)

## 2019-07-20 LAB — POCT URINALYSIS DIPSTICK
Glucose, UA: NEGATIVE
Ketones, UA: NEGATIVE
Leukocytes, UA: NEGATIVE
Nitrite, UA: NEGATIVE
Odor: NEGATIVE
Protein, UA: POSITIVE — AB
Spec Grav, UA: 1.025 (ref 1.010–1.025)
Urobilinogen, UA: 0.2 E.U./dL
pH, UA: 6 (ref 5.0–8.0)

## 2019-07-20 LAB — HEPATIC FUNCTION PANEL
ALT: 18 U/L (ref 0–35)
AST: 17 U/L (ref 0–37)
Albumin: 4.2 g/dL (ref 3.5–5.2)
Alkaline Phosphatase: 66 U/L (ref 39–117)
Bilirubin, Direct: 0.1 mg/dL (ref 0.0–0.3)
Total Bilirubin: 0.9 mg/dL (ref 0.2–1.2)
Total Protein: 6.9 g/dL (ref 6.0–8.3)

## 2019-07-20 LAB — TSH: TSH: 2.57 u[IU]/mL (ref 0.35–4.50)

## 2019-07-20 NOTE — Progress Notes (Signed)
Subjective:     Patient ID: Heather Oneal, female   DOB: 21-Aug-1941, 78 y.o.   MRN: [id number]  HPI Patient is seen with multitude of complaints including some increased fatigue, left ankle/ foot edema, and urine frequency.  Refer to note from yesterday for further details  Patient has chronic problems including hyperlipidemia, obesity, history of depression, irritable bowel syndrome.  She had called with concerns that she has had some left foot and ankle swelling.  She has some edema of the right but somewhat asymmetric.  Has been present for several days if not weeks.  Edema worse late in the day.  She does have some dyspnea last week but no chest pain.  No cough.  No fever.  General fatigue issues.  She has also had some frequent urination which has been going on for months but no burning with urination.  Her ankle and foot edema are not new.  She thinks this may be partly related to less activity recently during current pandemic.  She has urine frequency but no burning.  Occasional mild urgency.  Minimal nocturia.  No recent fevers or chills.  Appetite and weight are stable.  General fatigue.  She thinks again some of this may be stress related and also less activity with not going to the gym.  Her husband has Parkinson's disease and has lots of health care needs.  No history of observed apnea.  Minimal daytime somnolence.  Past Medical History:  Diagnosis Date  . Allergy    seasonal  . Anemia    as a teenager  . Depression   . DEPRESSION 01/28/2010  . History of IBS   . Hyperlipidemia   . HYPERLIPIDEMIA 01/28/2010   Past Surgical History:  Procedure Laterality Date  . APPENDECTOMY  1973  . CHOLECYSTECTOMY  2008  . COLONOSCOPY    . OVARIAN CYST SURGERY  1973   ruptured ovarian cyst  . POLYPECTOMY    . TONSILLECTOMY  1948    reports that she has never smoked. She has never used smokeless tobacco. She reports current alcohol use. She reports that she does not use  drugs. family history includes Cancer (age of onset: 68) in her sister; Crohn's disease in her brother; Diabetes in her maternal grandmother; Heart disease in her mother and paternal grandfather; Hypertension in her mother; Stomach cancer in her paternal grandmother. Allergies  Allergen Reactions  . Crestor [Rosuvastatin]     " Ears itching and throat burning"      Review of Systems  Constitutional: Positive for fatigue. Negative for appetite change, chills and fever.  Respiratory: Negative for cough and shortness of breath.   Cardiovascular: Positive for leg swelling. Negative for chest pain and palpitations.  Gastrointestinal: Negative for abdominal pain and blood in stool.  Genitourinary: Positive for frequency. Negative for difficulty urinating, dysuria, hematuria and vaginal pain.  Neurological: Negative for dizziness, seizures and weakness.       Objective:   Physical Exam Constitutional:      Appearance: Normal appearance.  Neck:     Musculoskeletal: Neck supple.  Cardiovascular:     Rate and Rhythm: Normal rate and regular rhythm.  Pulmonary:     Effort: Pulmonary effort is normal.     Breath sounds: Normal breath sounds.  Musculoskeletal:     Comments: Patient has mild pitting edema both feet and ankles left greater than right.  No calf tenderness.  Feet are warm to touch with good capillary refill  Neurological:  Mental Status: She is alert.        Assessment:     #1 chronic left ankle and foot edema slightly greater than right.  Suspect venous stasis given chronicity.  She does not have any acute pain or acute symptoms to suggest likely DVT.  #2 general fatigue.  May be multifactorial.  #3 urine frequency.  She does not have burning or other suggestion of infection.  Minimal urgency    Plan:     -Obtain further labs with urinalysis, TSH, basic metabolic panel, CBC, hepatic panel -Discussed management of venous stasis including weight loss, regular  exercise, compression.  She is reluctant to start compression hose at this time.  Would avoid regular use of diuretics -Discussed possible medications for urgency but at this point the symptoms are relatively mild.  Check urinalysis as above -Discussed other potential etiologies of fatigue including obstructive apnea but at this point she feels like that is a low likelihood  Eulas Post MD [facility] at [facility]

## 2019-07-20 NOTE — Patient Instructions (Signed)

## 2019-07-25 ENCOUNTER — Telehealth: Payer: Self-pay | Admitting: Internal Medicine

## 2019-07-25 NOTE — Telephone Encounter (Signed)
Patient rescheduled her appt that was scheduled for tomorrow 8/4 with Dr. Henrene Pastor for a colonoscopy because she hurt her foot and can barley walk.

## 2019-07-25 NOTE — Telephone Encounter (Signed)
Procedure rescheduled.  No charge

## 2019-07-26 ENCOUNTER — Encounter: Payer: PPO | Admitting: Internal Medicine

## 2019-08-01 ENCOUNTER — Other Ambulatory Visit: Payer: Self-pay

## 2019-08-01 ENCOUNTER — Ambulatory Visit (INDEPENDENT_AMBULATORY_CARE_PROVIDER_SITE_OTHER): Payer: PPO | Admitting: Family Medicine

## 2019-08-01 ENCOUNTER — Encounter: Payer: Self-pay | Admitting: Family Medicine

## 2019-08-01 VITALS — BP 112/70 | HR 81 | Temp 98.0°F | Ht 66.0 in | Wt 254.2 lb

## 2019-08-01 DIAGNOSIS — R319 Hematuria, unspecified: Secondary | ICD-10-CM | POA: Diagnosis not present

## 2019-08-01 LAB — POCT URINALYSIS DIPSTICK
Bilirubin, UA: NEGATIVE
Glucose, UA: NEGATIVE
Ketones, UA: NEGATIVE
Nitrite, UA: NEGATIVE
Protein, UA: POSITIVE — AB
Spec Grav, UA: 1.02 (ref 1.010–1.025)
Urobilinogen, UA: 0.2 E.U./dL
pH, UA: 6 (ref 5.0–8.0)

## 2019-08-01 LAB — URINALYSIS, MICROSCOPIC ONLY

## 2019-08-01 NOTE — Progress Notes (Signed)
  Subjective:     Patient ID: Heather Oneal, female   DOB: 04/19/1941, 78 y.o.   MRN: [id number]  HPI Patient was seen recently in office on 29 July for increased fatigue and some lower extremity edema and urine frequency.  We obtain urinalysis which showed 1+ blood and positive protein.  Patient has never seen any gross hematuria.  No history of kidney stones.  No recent flank pain.  No suprapubic pain.  No burning with urination.  We recommended follow-up to repeat urine and if any persistent hematuria urine microscopy.  No recent appetite or weight changes.  Past Medical History:  Diagnosis Date  . Allergy    seasonal  . Anemia    as a teenager  . Depression   . DEPRESSION 01/28/2010  . History of IBS   . Hyperlipidemia   . HYPERLIPIDEMIA 01/28/2010   Past Surgical History:  Procedure Laterality Date  . APPENDECTOMY  1973  . CHOLECYSTECTOMY  2008  . COLONOSCOPY    . OVARIAN CYST SURGERY  1973   ruptured ovarian cyst  . POLYPECTOMY    . TONSILLECTOMY  1948    reports that she has never smoked. She has never used smokeless tobacco. She reports current alcohol use. She reports that she does not use drugs. family history includes Cancer (age of onset: 25) in her sister; Crohn's disease in her brother; Diabetes in her maternal grandmother; Heart disease in her mother and paternal grandfather; Hypertension in her mother; Stomach cancer in her paternal grandmother. Allergies  Allergen Reactions  . Crestor [Rosuvastatin]     " Ears itching and throat burning"     Review of Systems  Constitutional: Negative for chills and fever.  Gastrointestinal: Negative for abdominal pain.  Genitourinary: Negative for difficulty urinating and flank pain.       Objective:   Physical Exam Constitutional:      Appearance: Normal appearance.  Cardiovascular:     Rate and Rhythm: Normal rate and regular rhythm.  Pulmonary:     Effort: Pulmonary effort is normal.     Breath sounds: Normal  breath sounds.  Neurological:     Mental Status: She is alert.        Assessment:     Hematuria recently on urine dipstick    Plan:     -We explained that dipstick findings are very nonspecific.  She will repeat urinalysis today with dipstick and if positive send for urine microscopy. -Urine dipstick today does confirm moderate blood.  Urine microscopy sent.  If greater than or equal to 3 red blood cells per high-power field will send for urology evaluation  Eulas Post MD [facility] at [facility]

## 2019-08-01 NOTE — Patient Instructions (Signed)
Hematuria, Adult Hematuria is blood in the urine. Blood may be visible in the urine, or it may be identified with a test. This condition can be caused by infections of the bladder, urethra, kidney, or prostate. Other possible causes include:  Kidney stones.  Cancer of the urinary tract.  Too much calcium in the urine.  Conditions that are passed from parent to child (inherited conditions).  Exercise that requires a lot of energy. Infections can usually be treated with medicine, and a kidney stone usually will pass through your urine. If neither of these is the cause of your hematuria, more tests may be needed to identify the cause of your symptoms. It is very important to tell your health care provider about any blood in your urine, even if it is painless or the blood stops without treatment. Blood in the urine, when it happens and then stops and then happens again, can be a symptom of a very serious condition, including cancer. There is no pain in the initial stages of many urinary cancers. Follow these instructions at home: Medicines  Take over-the-counter and prescription medicines only as told by your health care provider.  If you were prescribed an antibiotic medicine, take it as told by your health care provider. Do not stop taking the antibiotic even if you start to feel better. Eating and drinking  Drink enough fluid to keep your urine clear or pale yellow. It is recommended that you drink 3-4 quarts (2.8-3.8 L) a day. If you have been diagnosed with an infection, it is recommended that you drink cranberry juice in addition to large amounts of water.  Avoid caffeine, tea, and carbonated beverages. These tend to irritate the bladder.  Avoid alcohol because it may irritate the prostate (men). General instructions  If you have been diagnosed with a kidney stone, follow your health care provider's instructions about straining your urine to catch the stone.  Empty your bladder  often. Avoid holding urine for long periods of time.  If you are female: ? After a bowel movement, wipe from front to back and use each piece of toilet paper only once. ? Empty your bladder before and after sex.  Pay attention to any changes in your symptoms. Tell your health care provider about any changes or any new symptoms.  It is your responsibility to get your test results. Ask your health care provider, or the department performing the test, when your results will be ready.  Keep all follow-up visits as told by your health care provider. This is important. Contact a health care provider if:  You develop back pain.  You have a fever.  You have nausea or vomiting.  Your symptoms do not improve after 3 days.  Your symptoms get worse. Get help right away if:  You develop severe vomiting and are unable take medicine without vomiting.  You develop severe pain in your back or abdomen even though you are taking medicine.  You pass a large amount of blood in your urine.  You pass blood clots in your urine.  You feel very weak or like you might faint.  You faint. Summary  Hematuria is blood in the urine. It has many possible causes.  It is very important that you tell your health care provider about any blood in your urine, even if it is painless or the blood stops without treatment.  Take over-the-counter and prescription medicines only as told by your health care provider.  Drink enough fluid to keep   your urine clear or pale yellow. This information is not intended to replace advice given to you by your health care provider. Make sure you discuss any questions you have with your health care provider. Document Released: 12/08/2005 Document Revised: 11/20/2017 Document Reviewed: 01/10/2017 Elsevier Patient Education  2020 [employer].

## 2019-08-02 ENCOUNTER — Other Ambulatory Visit: Payer: Self-pay

## 2019-08-02 MED ORDER — CEPHALEXIN 500 MG PO CAPS: 500.0000 mg | ORAL_CAPSULE | Freq: Four times a day (QID) | ORAL | 0 refills | Status: DC

## 2019-08-19 ENCOUNTER — Encounter: Payer: Self-pay | Admitting: Internal Medicine

## 2019-08-23 ENCOUNTER — Encounter: Payer: PPO | Admitting: Internal Medicine

## 2019-08-23 DIAGNOSIS — Z1231 Encounter for screening mammogram for malignant neoplasm of breast: Secondary | ICD-10-CM | POA: Diagnosis not present

## 2019-08-25 ENCOUNTER — Encounter: Payer: PPO | Admitting: Internal Medicine

## 2019-08-26 DIAGNOSIS — N951 Menopausal and female climacteric states: Secondary | ICD-10-CM | POA: Diagnosis not present

## 2019-08-26 DIAGNOSIS — Z01419 Encounter for gynecological examination (general) (routine) without abnormal findings: Secondary | ICD-10-CM | POA: Diagnosis not present

## 2019-08-26 DIAGNOSIS — E669 Obesity, unspecified: Secondary | ICD-10-CM | POA: Diagnosis not present

## 2019-08-26 DIAGNOSIS — B373 Candidiasis of vulva and vagina: Secondary | ICD-10-CM | POA: Diagnosis not present

## 2019-08-26 DIAGNOSIS — N393 Stress incontinence (female) (male): Secondary | ICD-10-CM | POA: Diagnosis not present

## 2019-08-26 DIAGNOSIS — F329 Major depressive disorder, single episode, unspecified: Secondary | ICD-10-CM | POA: Diagnosis not present

## 2019-09-06 ENCOUNTER — Telehealth (INDEPENDENT_AMBULATORY_CARE_PROVIDER_SITE_OTHER): Payer: PPO | Admitting: Family Medicine

## 2019-09-06 ENCOUNTER — Other Ambulatory Visit: Payer: Self-pay

## 2019-09-06 DIAGNOSIS — J309 Allergic rhinitis, unspecified: Secondary | ICD-10-CM | POA: Diagnosis not present

## 2019-09-06 MED ORDER — MONTELUKAST SODIUM 10 MG PO TABS: 10.0000 mg | ORAL_TABLET | Freq: Every day | ORAL | 1 refills | Status: DC

## 2019-09-06 MED ORDER — OLOPATADINE HCL 0.1 % OP SOLN: 1.0000 [drp] | Freq: Two times a day (BID) | OPHTHALMIC | 12 refills | Status: DC | PRN

## 2019-09-06 NOTE — Progress Notes (Signed)
This visit type was conducted due to national recommendations for restrictions regarding the COVID-19 pandemic in an effort to limit this patient's exposure and mitigate transmission in our community.   Virtual Visit via Telephone Note  I connected with Heather Oneal on 09/06/19 at  4:15 PM EDT by telephone and verified that I am speaking with the correct person using two identifiers.   I discussed the limitations, risks, security and privacy concerns of performing an evaluation and management service by telephone and the availability of in person appointments. I also discussed with the patient that there may be a patient responsible charge related to this service. The patient expressed understanding and agreed to proceed.  Location patient: home Location provider: work or home office Participants present for the call: patient, provider Patient did not have a visit in the prior 7 days to address this/these issue(s).   History of Present Illness: Patient has long history of seasonal allergies.  She had some recent nasal congestion but also having frequent tearing of right eye greater than left.  No blurred vision.  No redness.  No purulent secretions.  No eye pain.  No dizziness.  She has taken Allegra in the past for allergies but thinks this may have caused some fatigue issues.  She has also used Flonase and Singulair and is out of both.  She has tried plain Mucinex without any improvement.  No recent fevers or chills.   Observations/Objective: Patient sounds cheerful and well on the phone. I do not appreciate any SOB. Speech and thought processing are grossly intact. Patient reported vitals: NA  Assessment and Plan:  Seasonal and perennial allergic rhinitis with probable allergic conjunctivitis symptoms  -Refill Singulair to take once daily -Suggested over-the-counter Flonase to use daily -Patanol eyedrops twice daily as needed -Office follow-up if not improving with the  above  Follow Up Instructions:  -As above   [id number] [id number] [id number] 11-20 [id number] 21-30 I did not refer this patient for an OV in the next 24 hours for this/these issue(s).  I discussed the assessment and treatment plan with the patient. The patient was provided an opportunity to ask questions and all were answered. The patient agreed with the plan and demonstrated an understanding of the instructions.   The patient was advised to call back or seek an in-person evaluation if the symptoms worsen or if the condition fails to improve as anticipated.  I provided 18 minutes of non-face-to-face time during this encounter.   Carolann Littler, MD

## 2019-09-30 ENCOUNTER — Other Ambulatory Visit: Payer: Self-pay

## 2019-09-30 ENCOUNTER — Ambulatory Visit: Payer: PPO | Admitting: *Deleted

## 2019-09-30 VITALS — Temp 96.4°F | Ht 67.0 in | Wt 255.0 lb

## 2019-09-30 DIAGNOSIS — Z86010 Personal history of colonic polyps: Secondary | ICD-10-CM

## 2019-09-30 MED ORDER — PLENVU 140 G PO SOLR: 1.0000 | Freq: Once | ORAL | 0 refills | Status: AC

## 2019-09-30 NOTE — Progress Notes (Signed)
No egg or soy allergy known to patient  No issues with past sedation with any surgeries  or procedures, no intubation problems  No diet pills per patient No home 02 use per patient  No blood thinners per patient  Pt has occasional issues with constipation, patient occasionally takes Miralax.  Patient instructed to take Miralax daily  2 days prior to procedure No A fib or A flutter  PLENVU sample given to the patient, lot # [id number], exp 03/2020  Due to the COVID-19 pandemic we are asking patients to follow these guidelines. Please only bring one care partner. Please be aware that your care partner may wait in the car in the parking lot or if they feel like they will be too hot to wait in the car, they may wait in the lobby on the 4th floor. All care partners are required to wear a mask the entire time (we do not have any that we can provide them), they need to practice social distancing, and we will do a Covid check for all patient's and care partners when you arrive. Also we will check their temperature and your temperature. If the care partner waits in their car they need to stay in the parking lot the entire time and we will call them on their cell phone when the patient is ready for discharge so they can bring the car to the front of the building. Also all patient's will need to wear a mask into building.

## 2019-10-03 ENCOUNTER — Encounter: Payer: Self-pay | Admitting: Internal Medicine

## 2019-10-07 ENCOUNTER — Ambulatory Visit (INDEPENDENT_AMBULATORY_CARE_PROVIDER_SITE_OTHER): Payer: PPO | Admitting: Family Medicine

## 2019-10-07 ENCOUNTER — Other Ambulatory Visit: Payer: Self-pay

## 2019-10-07 ENCOUNTER — Encounter: Payer: Self-pay | Admitting: Family Medicine

## 2019-10-07 VITALS — BP 124/82 | HR 75 | Temp 97.6°F | Ht 66.0 in | Wt 255.3 lb

## 2019-10-07 DIAGNOSIS — Z780 Asymptomatic menopausal state: Secondary | ICD-10-CM

## 2019-10-07 DIAGNOSIS — Z0000 Encounter for general adult medical examination without abnormal findings: Secondary | ICD-10-CM | POA: Diagnosis not present

## 2019-10-07 DIAGNOSIS — E785 Hyperlipidemia, unspecified: Secondary | ICD-10-CM | POA: Diagnosis not present

## 2019-10-07 LAB — LIPID PANEL
Cholesterol: 277 mg/dL — ABNORMAL HIGH (ref 0–200)
HDL: 45.4 mg/dL (ref >39.00)
NonHDL: 231.6
Total CHOL/HDL Ratio: 6
Triglycerides: 236 mg/dL — ABNORMAL HIGH (ref 0.0–149.0)
VLDL: 47.2 mg/dL — ABNORMAL HIGH (ref 0.0–40.0)

## 2019-10-07 LAB — LDL CHOLESTEROL, DIRECT: Direct LDL: 185 mg/dL

## 2019-10-07 NOTE — Patient Instructions (Signed)
Preventive Care 78 Years and Older, Female Preventive care refers to lifestyle choices and visits with your health care provider that can promote health and wellness. This includes:  A yearly physical exam. This is also called an annual well check.  Regular dental and eye exams.  Immunizations.  Screening for certain conditions.  Healthy lifestyle choices, such as diet and exercise. What can I expect for my preventive care visit? Physical exam Your health care provider will check:  Height and weight. These may be used to calculate body mass index (BMI), which is a measurement that tells if you are at a healthy weight.  Heart rate and blood pressure.  Your skin for abnormal spots. Counseling Your health care provider may ask you questions about:  Alcohol, tobacco, and drug use.  Emotional well-being.  Home and relationship well-being.  Sexual activity.  Eating habits.  History of falls.  Memory and ability to understand (cognition).  Work and work Statistician.  Pregnancy and menstrual history. What immunizations do I need?  Influenza (flu) vaccine  This is recommended every year. Tetanus, diphtheria, and pertussis (Tdap) vaccine  You may need a Td booster every 10 years. Varicella (chickenpox) vaccine  You may need this vaccine if you have not already been vaccinated. Zoster (shingles) vaccine  You may need this after age 78. Pneumococcal conjugate (PCV13) vaccine  One dose is recommended after age 78. Pneumococcal polysaccharide (PPSV23) vaccine  One dose is recommended after age 72. Measles, mumps, and rubella (MMR) vaccine  You may need at least one dose of MMR if you were born in [birth year] or later. You may also need a second dose. Meningococcal conjugate (MenACWY) vaccine  You may need this if you have certain conditions. Hepatitis A vaccine  You may need this if you have certain conditions or if you travel or work in places where you may be exposed  to hepatitis A. Hepatitis B vaccine  You may need this if you have certain conditions or if you travel or work in places where you may be exposed to hepatitis B. Haemophilus influenzae type b (Hib) vaccine  You may need this if you have certain conditions. You may receive vaccines as individual doses or as more than one vaccine together in one shot (combination vaccines). Talk with your health care provider about the risks and benefits of combination vaccines. What tests do I need? Blood tests  Lipid and cholesterol levels. These may be checked every 5 years, or more frequently depending on your overall health.  Hepatitis C test.  Hepatitis B test. Screening  Lung cancer screening. You may have this screening every year starting at age 78 if you have a 30-pack-year history of smoking and currently smoke or have quit within the past 15 years.  Colorectal cancer screening. All adults should have this screening starting at age 78 and continuing until age 15. Your health care provider may recommend screening at age 23 if you are at increased risk. You will have tests every 1-10 years, depending on your results and the type of screening test.  Diabetes screening. This is done by checking your blood sugar (glucose) after you have not eaten for a while (fasting). You may have this done every 1-3 years.  Mammogram. This may be done every 1-2 years. Talk with your health care provider about how often you should have regular mammograms.  BRCA-related cancer screening. This may be done if you have a family history of breast, ovarian, tubal, or peritoneal cancers.  Other tests  Sexually transmitted disease (STD) testing.  Bone density scan. This is done to screen for osteoporosis. You may have this done starting at age 78. Follow these instructions at home: Eating and drinking  Eat a diet that includes fresh fruits and vegetables, whole grains, lean protein, and low-fat dairy products. Limit  your intake of foods with high amounts of sugar, saturated fats, and salt.  Take vitamin and mineral supplements as recommended by your health care provider.  Do not drink alcohol if your health care provider tells you not to drink.  If you drink alcohol: ? Limit how much you have to 0-1 drink a day. ? Be aware of how much alcohol is in your drink. In the [place] one drink equals one 12 oz bottle of beer (355 mL), one 5 oz glass of wine (148 mL), or one 1 oz glass of hard liquor (44 mL). Lifestyle  Take daily care of your teeth and gums.  Stay active. Exercise for at least 30 minutes on 5 or more days each week.  Do not use any products that contain nicotine or tobacco, such as cigarettes, e-cigarettes, and chewing tobacco. If you need help quitting, ask your health care provider.  If you are sexually active, practice safe sex. Use a condom or other form of protection in order to prevent STIs (sexually transmitted infections).  Talk with your health care provider about taking a low-dose aspirin or statin. What's next?  Go to your health care provider once a year for a well check visit.  Ask your health care provider how often you should have your eyes and teeth checked.  Stay up to date on all vaccines. This information is not intended to replace advice given to you by your health care provider. Make sure you discuss any questions you have with your health care provider. Document Released: 01/04/2016 Document Revised: 12/02/2018 Document Reviewed: 12/02/2018 Elsevier Patient Education  [employer].  Consider Shingles vaccine (Shingrix) and check on insurance if interested Let's set up bone density scan (DEXA).   Consider venous compression for left leg edema.

## 2019-10-07 NOTE — Progress Notes (Signed)
Subjective:     Patient ID: Heather Oneal, female   DOB: 1941/01/04, 78 y.o.   MRN: [id number]  HPI   Bryttni is here for physical exam.  She just turned 24.  Her chronic problems include history of obesity, venous stasis with some chronic left lower extremity greater than right, IBS, history of depression, hyperlipidemia.  She has some perennial allergies and seasonal allergies.  She takes Singulair and antihistamine for those.  Health maintenance reviewed.  -Colonoscopy scheduled for next week -She wants to defer flu vaccine until after her colonoscopy -No prior DEXA scan -No history of shingles vaccine -Had mammogram earlier this year -Tetanus up-to-date and pneumonia vaccines up-to-date  Past Medical History:  Diagnosis Date  . Allergy    seasonal  . Anemia    as a teenager  . Anxiety   . Depression   . DEPRESSION 01/28/2010  . History of IBS   . Hyperlipidemia   . HYPERLIPIDEMIA 01/28/2010   Past Surgical History:  Procedure Laterality Date  . APPENDECTOMY  1973  . CHOLECYSTECTOMY  2008  . COLONOSCOPY    . OVARIAN CYST SURGERY  1973   ruptured ovarian cyst  . POLYPECTOMY    . TONSILLECTOMY  1948    reports that she has never smoked. She has never used smokeless tobacco. She reports current alcohol use. She reports that she does not use drugs. family history includes Cancer (age of onset: 16) in her sister; Crohn's disease in her brother; Diabetes in her maternal grandmother; Heart disease in her mother and paternal grandfather; Hypertension in her mother; Stomach cancer in her paternal grandmother. Allergies  Allergen Reactions  . Crestor [Rosuvastatin]     " Ears itching and throat burning"     Review of Systems  Constitutional: Negative for activity change, appetite change, fatigue, fever and unexpected weight change.  HENT: Negative for ear pain, hearing loss, sore throat and trouble swallowing.   Eyes: Negative for visual disturbance.  Respiratory:  Negative for cough and shortness of breath.   Cardiovascular: Negative for chest pain and palpitations.  Gastrointestinal: Negative for abdominal pain, blood in stool, constipation and diarrhea.  Genitourinary: Negative for dysuria and hematuria.  Musculoskeletal: Negative for arthralgias, back pain and myalgias.  Skin: Negative for rash.  Neurological: Negative for dizziness, syncope and headaches.  Hematological: Negative for adenopathy.  Psychiatric/Behavioral: Negative for confusion and dysphoric mood.       Objective:   Physical Exam Constitutional:      Appearance: She is well-developed.  HENT:     Head: Normocephalic and atraumatic.  Eyes:     Pupils: Pupils are equal, round, and reactive to light.  Neck:     Musculoskeletal: Normal range of motion and neck supple.     Thyroid: No thyromegaly.  Cardiovascular:     Rate and Rhythm: Normal rate and regular rhythm.     Heart sounds: Normal heart sounds. No murmur.  Pulmonary:     Effort: No respiratory distress.     Breath sounds: Normal breath sounds. No wheezing or rales.  Abdominal:     General: Bowel sounds are normal. There is no distension.     Palpations: Abdomen is soft. There is no mass.     Tenderness: There is no abdominal tenderness. There is no guarding or rebound.  Musculoskeletal: Normal range of motion.     Comments: She has edema left lower extremity greater than right which is chronic.  No ulcerations  Lymphadenopathy:  Cervical: No cervical adenopathy.  Skin:    Findings: No rash.  Neurological:     Mental Status: She is alert and oriented to person, place, and time.     Cranial Nerves: No cranial nerve deficit.     Deep Tendon Reflexes: Reflexes normal.  Psychiatric:        Behavior: Behavior normal.        Thought Content: Thought content normal.        Judgment: Judgment normal.        Assessment:     Physical exam.  We discussed the following health maintenance issues    Plan:      -Flu vaccine recommended but she defers until after her colonoscopy -Set up DEXA scan -Discussed shingles vaccine and she will check on insurance coverage -Check lipids with her history of hyperlipidemia -Repeat colonoscopy scheduled for next week -Continue at least every other year mammogram  Eulas Post MD [facility]

## 2019-10-10 ENCOUNTER — Ambulatory Visit: Payer: Self-pay

## 2019-10-10 DIAGNOSIS — E785 Hyperlipidemia, unspecified: Secondary | ICD-10-CM

## 2019-10-10 MED ORDER — ATORVASTATIN CALCIUM 20 MG PO TABS: 20.0000 mg | ORAL_TABLET | Freq: Every day | ORAL | 0 refills | Status: DC

## 2019-10-10 NOTE — Telephone Encounter (Signed)
Medication was sent to pharmacy as [facility] triage nurse informed pt about lab result  Future labs ordered.   Marland Kitchen

## 2019-10-10 NOTE — Telephone Encounter (Signed)
Patient states that she will try the Lipitor 20mg .  Please call to [facility] on [address]  Generic please.

## 2019-10-13 ENCOUNTER — Telehealth: Payer: Self-pay

## 2019-10-13 NOTE — Telephone Encounter (Signed)
Covid-19 screening questions   Do you now or have you had a fever in the last 14 days?  Do you have any respiratory symptoms of shortness of breath or cough now or in the last 14 days?  Do you have any family members or close contacts with diagnosed or suspected Covid-19 in the past 14 days?  Have you been tested for Covid-19 and found to be positive?       

## 2019-10-14 ENCOUNTER — Ambulatory Visit (AMBULATORY_SURGERY_CENTER): Payer: PPO | Admitting: Internal Medicine

## 2019-10-14 ENCOUNTER — Other Ambulatory Visit: Payer: Self-pay

## 2019-10-14 ENCOUNTER — Encounter: Payer: Self-pay | Admitting: Internal Medicine

## 2019-10-14 VITALS — BP 119/73 | HR 82 | Temp 98.2°F | Resp 19 | Ht 67.0 in | Wt 255.0 lb

## 2019-10-14 DIAGNOSIS — D122 Benign neoplasm of ascending colon: Secondary | ICD-10-CM | POA: Diagnosis not present

## 2019-10-14 DIAGNOSIS — D124 Benign neoplasm of descending colon: Secondary | ICD-10-CM | POA: Diagnosis not present

## 2019-10-14 DIAGNOSIS — K621 Rectal polyp: Secondary | ICD-10-CM

## 2019-10-14 DIAGNOSIS — D129 Benign neoplasm of anus and anal canal: Secondary | ICD-10-CM

## 2019-10-14 DIAGNOSIS — Z86010 Personal history of colonic polyps: Secondary | ICD-10-CM

## 2019-10-14 DIAGNOSIS — D128 Benign neoplasm of rectum: Secondary | ICD-10-CM

## 2019-10-14 MED ORDER — SODIUM CHLORIDE 0.9 % IV SOLN: 500.0000 mL | Freq: Once | INTRAVENOUS | Status: DC

## 2019-10-14 NOTE — Op Note (Signed)
[facility] Patient Name: Heather Oneal Procedure Date: 10/14/2019 1:30 PM MRN: [id number] Endoscopist: Docia Chuck. Henrene Pastor , MD Age: 78 Referring MD:  Date of Birth: 29-Nov-1941 Gender: Female Account #: [id number] Procedure:                Colonoscopy with cold snare polypectomy x 3 Indications:              High risk colon cancer surveillance: Personal                            history of multiple (3 or more) adenomas. Previous                            examination April 2016. Medicines:                Monitored Anesthesia Care Procedure:                Pre-Anesthesia Assessment:                           - Prior to the procedure, a History and Physical                            was performed, and patient medications and                            allergies were reviewed. The patient's tolerance of                            previous anesthesia was also reviewed. The risks                            and benefits of the procedure and the sedation                            options and risks were discussed with the patient.                            All questions were answered, and informed consent                            was obtained. Prior Anticoagulants: The patient has                            taken no previous anticoagulant or antiplatelet                            agents. ASA Grade Assessment: II - A patient with                            mild systemic disease. After reviewing the risks                            and benefits, the patient was deemed in  satisfactory condition to undergo the procedure.                           After obtaining informed consent, the colonoscope                            was passed under direct vision. Throughout the                            procedure, the patient's blood pressure, pulse, and                            oxygen saturations were monitored continuously. The   Colonoscope was introduced through the anus and                            advanced to the the cecum, identified by                            appendiceal orifice and ileocecal valve. The                            ileocecal valve, appendiceal orifice, and rectum                            were photographed. The quality of the bowel                            preparation was excellent. The colonoscopy was                            performed without difficulty. The patient tolerated                            the procedure well. The bowel preparation used was                            SUPREP via split dose instruction. Scope In: 1:37:13 PM Scope Out: 1:57:09 PM Scope Withdrawal Time: 0 hours 13 minutes 40 seconds  Total Procedure Duration: 0 hours 19 minutes 56 seconds  Findings:                 Three polyps were found in the rectum, descending                            colon and ascending colon. The polyps were 2 to 3                            mm in size. These polyps were removed with a cold                            snare. Resection and retrieval were complete.  Multiple diverticula were found in the sigmoid                            colon.                           Internal hemorrhoids were found during                            retroflexion. The hemorrhoids were moderate.                           The exam was otherwise without abnormality on                            direct and retroflexion views. Complications:            No immediate complications. Estimated blood loss:                            None. Estimated Blood Loss:     Estimated blood loss: none. Impression:               - Three 2 to 3 mm polyps in the rectum, in the                            descending colon and in the ascending colon,                            removed with a cold snare. Resected and retrieved.                           - Diverticulosis in the sigmoid colon.                            - Internal hemorrhoids.                           - The examination was otherwise normal on direct                            and retroflexion views. Recommendation:           - Repeat colonoscopy is not recommended for                            surveillance.                           - Patient has a contact number available for                            emergencies. The signs and symptoms of potential                            delayed complications were discussed with the  patient. Return to normal activities tomorrow.                            Written discharge instructions were provided to the                            patient.                           - Resume previous diet.                           - Continue present medications.                           - Await pathology results. Docia Chuck. Henrene Pastor, MD 10/14/2019 2:02:53 PM This report has been signed electronically.

## 2019-10-14 NOTE — Progress Notes (Signed)
Called to room to assist during endoscopic procedure.  Patient ID and intended procedure confirmed with present staff. Received instructions for my participation in the procedure from the performing physician.  

## 2019-10-14 NOTE — Progress Notes (Signed)
Temperature- Karina Athens  VS- Courtney Washington  Pt's states no medical or surgical changes since previsit or office visit.  

## 2019-10-14 NOTE — Progress Notes (Signed)
PT taken to PACU. Monitors in place. VSS. Report given to RN. 

## 2019-10-14 NOTE — Patient Instructions (Signed)
HANDOUTS PROVIDED ON: POLYPS, DIVERTICULOSIS, & HEMORRHOIDS  THE POLYPS REMOVED HAVE BEEN SENT FOR PATHOLOGY.  THE RESULTS WILL TAKE 2-3 WEEKS TO RECEIVE.    YOU MAY RESUME YOUR PREVIOUS DIET AND MEDICATION SCHEDULE.  [place] YOU FOR ALLOWING [place] TO CARE FOR YOU TODAY!!  YOU HAD AN ENDOSCOPIC PROCEDURE TODAY AT [facility]:   Refer to the procedure report that was given to you for any specific questions about what was found during the examination.  If the procedure report does not answer your questions, please call your gastroenterologist to clarify.  If you requested that your care partner not be given the details of your procedure findings, then the procedure report has been included in a sealed envelope for you to review at your convenience later.  YOU SHOULD EXPECT: Some feelings of bloating in the abdomen. Passage of more gas than usual.  Walking can help get rid of the air that was put into your GI tract during the procedure and reduce the bloating. If you had a lower endoscopy (such as a colonoscopy or flexible sigmoidoscopy) you may notice spotting of blood in your stool or on the toilet paper. If you underwent a bowel prep for your procedure, you may not have a normal bowel movement for a few days.  Please Note:  You might notice some irritation and congestion in your nose or some drainage.  This is from the oxygen used during your procedure.  There is no need for concern and it should clear up in a day or so.  SYMPTOMS TO REPORT IMMEDIATELY:   Following lower endoscopy (colonoscopy or flexible sigmoidoscopy):  Excessive amounts of blood in the stool  Significant tenderness or worsening of abdominal pains  Swelling of the abdomen that is new, acute  Fever of 100F or higher  For urgent or emergent issues, a gastroenterologist can be reached at any hour by calling [phone].   DIET:  We do recommend a small meal at first, but then you may proceed to your regular  diet.  Drink plenty of fluids but you should avoid alcoholic beverages for 24 hours.  ACTIVITY:  You should plan to take it easy for the rest of today and you should NOT DRIVE or use heavy machinery until tomorrow (because of the sedation medicines used during the test).    FOLLOW UP: Our staff will call the number listed on your records 48-72 hours following your procedure to check on you and address any questions or concerns that you may have regarding the information given to you following your procedure. If we do not reach you, we will leave a message.  We will attempt to reach you two times.  During this call, we will ask if you have developed any symptoms of COVID 19. If you develop any symptoms (ie: fever, flu-like symptoms, shortness of breath, cough etc.) before then, please call [phone].  If you test positive for Covid 19 in the 2 weeks post procedure, please call and report this information to [place].    If any biopsies were taken you will be contacted by phone or by letter within the next 1-3 weeks.  Please call us at [phone] if you have not heard about the biopsies in 3 weeks.    SIGNATURES/CONFIDENTIALITY: You and/or your care partner have signed paperwork which will be entered into your electronic medical record.  These signatures attest to the fact that that the information above on your After Visit Summary has  been reviewed and is understood.  Full responsibility of the confidentiality of this discharge information lies with you and/or your care-partner.

## 2019-10-18 ENCOUNTER — Telehealth: Payer: Self-pay

## 2019-10-18 NOTE — Telephone Encounter (Signed)
Follow up call attempted.  NALM  

## 2019-10-20 ENCOUNTER — Encounter: Payer: Self-pay | Admitting: Internal Medicine

## 2020-02-19 ENCOUNTER — Ambulatory Visit: Payer: PPO | Attending: Internal Medicine

## 2020-02-19 ENCOUNTER — Other Ambulatory Visit: Payer: Self-pay

## 2020-02-19 DIAGNOSIS — Z23 Encounter for immunization: Secondary | ICD-10-CM | POA: Insufficient documentation

## 2020-02-19 NOTE — Progress Notes (Signed)
   Covid-19 Vaccination Clinic  Name:  Heather Oneal    MRN: [id number] DOB: [birth year]  02/19/2020  [name] was observed post Covid-19 immunization for 15 minutes without incidence. She was provided with Vaccine Information Sheet and instruction to access the V-Safe system.   [name] was instructed to call 911 with any severe reactions post vaccine: Marland Kitchen Difficulty breathing  . Swelling of your face and throat  . A fast heartbeat  . A bad rash all over your body  . Dizziness and weakness    Immunizations Administered    Name Date Dose VIS Date Route   Pfizer COVID-19 Vaccine 02/19/2020  4:11 PM 0.3 mL 12/02/2019 Intramuscular   Manufacturer: [employer]   Lot: [id number]   [place]: [id number]

## 2020-02-28 ENCOUNTER — Other Ambulatory Visit: Payer: Self-pay

## 2020-02-29 ENCOUNTER — Telehealth (INDEPENDENT_AMBULATORY_CARE_PROVIDER_SITE_OTHER): Payer: PPO | Admitting: Family Medicine

## 2020-02-29 DIAGNOSIS — K5900 Constipation, unspecified: Secondary | ICD-10-CM

## 2020-02-29 DIAGNOSIS — R142 Eructation: Secondary | ICD-10-CM

## 2020-02-29 NOTE — Progress Notes (Signed)
This visit type was conducted due to national recommendations for restrictions regarding the COVID-19 pandemic in an effort to limit this patient's exposure and mitigate transmission in our community.   Virtual Visit via Video Note  I connected with Heather Oneal on 02/29/20 at  4:00 PM EST by a video enabled telemedicine application and verified that I am speaking with the correct person using two identifiers.  Location patient: home Location provider:work or home office Persons participating in the virtual visit: patient, provider  I discussed the limitations of evaluation and management by telemedicine and the availability of in person appointments. The patient expressed understanding and agreed to proceed.   HPI: Heather Oneal had called with some symptoms Monday of increased constipation.  She had some increased belching and gas symptoms.  She initially reported fever but apparently her temperature is 98.6.  She started some over-the-counter MiraLAX and actually feels much better today.  She had made some recent dietary changes with reducing glucose and thinks she may have reduced some of her fiber intake in the process.  No nausea or vomiting.  She had colonoscopy back in October 2020 with 3 benign polyps removed.  Has good appetite.  No recent fever   ROS: See pertinent positives and negatives per HPI.  Past Medical History:  Diagnosis Date  . Allergy    seasonal  . Anemia    as a teenager  . Anxiety   . Depression   . DEPRESSION 01/28/2010  . History of IBS   . Hyperlipidemia   . HYPERLIPIDEMIA 01/28/2010    Past Surgical History:  Procedure Laterality Date  . APPENDECTOMY  1973  . CHOLECYSTECTOMY  2008  . COLONOSCOPY    . OVARIAN CYST SURGERY  1973   ruptured ovarian cyst  . POLYPECTOMY    . TONSILLECTOMY  1948    Family History  Problem Relation Age of Onset  . Heart disease Mother   . Hypertension Mother   . Cancer Sister 24       breast  . Crohn's disease Brother    . Diabetes Maternal Grandmother   . Heart disease Paternal Grandfather   . Stomach cancer Paternal Grandmother   . Colon cancer Neg Hx   . Pancreatic cancer Neg Hx   . Rectal cancer Neg Hx   . Esophageal cancer Neg Hx   . Colon polyps Neg Hx     SOCIAL HX: non-smoker.   Current Outpatient Medications:  .  atorvastatin (LIPITOR) 20 MG tablet, Take 1 tablet (20 mg total) by mouth daily., Disp: 60 tablet, Rfl: 0 .  cetirizine (ZYRTEC) 10 MG tablet, Take 10 mg by mouth daily., Disp: , Rfl:  .  fexofenadine (ALLEGRA) 180 MG tablet, Take 180 mg by mouth daily. Reported on 06/18/2016, Disp: , Rfl:  .  fluticasone (FLONASE) 50 MCG/ACT nasal spray, Place 1 spray into both nostrils daily., Disp: 16 g, Rfl: 2 .  guaiFENesin (MUCINEX PO), Take by mouth., Disp: , Rfl:  .  montelukast (SINGULAIR) 10 MG tablet, Take 1 tablet (10 mg total) by mouth at bedtime. (Patient not taking: Reported on 10/14/2019), Disp: 90 tablet, Rfl: 1 .  olopatadine (PATANOL) 0.1 % ophthalmic solution, Place 1 drop into both eyes 2 (two) times daily as needed for allergies., Disp: 5 mL, Rfl: 12 .  tiZANidine (ZANAFLEX) 4 MG tablet, Take 1 tablet (4 mg total) by mouth every 6 (six) hours as needed for muscle spasms., Disp: 30 tablet, Rfl: 0  EXAM:  VITALS per  patient if applicable:  GENERAL: alert, oriented, appears well and in no acute distress  HEENT: atraumatic, conjunttiva clear, no obvious abnormalities on inspection of external nose and ears  NECK: normal movements of the head and neck  LUNGS: on inspection no signs of respiratory distress, breathing rate appears normal, no obvious gross SOB, gasping or wheezing  CV: no obvious cyanosis  MS: moves all visible extremities without noticeable abnormality  PSYCH/NEURO: pleasant and cooperative, no obvious depression or anxiety, speech and thought processing grossly intact  ASSESSMENT AND PLAN:  Discussed the following assessment and plan:  Recent  constipation symptoms and increased gas and belching  -We recommend she follow a low FODMAPs diet for the next several weeks -Increase fluids -Continue MiraLAX as needed -Follow-up immediately for any fever, abdominal pain, or other concerns    I discussed the assessment and treatment plan with the patient. The patient was provided an opportunity to ask questions and all were answered. The patient agreed with the plan and demonstrated an understanding of the instructions.   The patient was advised to call back or seek an in-person evaluation if the symptoms worsen or if the condition fails to improve as anticipated.     Carolann Littler, MD

## 2020-03-02 ENCOUNTER — Other Ambulatory Visit: Payer: Self-pay

## 2020-03-02 ENCOUNTER — Telehealth: Payer: Self-pay | Admitting: Family Medicine

## 2020-03-02 NOTE — Telephone Encounter (Signed)
Called patient and left a detailed message that I wanted to get more information and if her symptoms become worse to go to urgent care or if severe go to ER.  Please see message. Not sure if she will call back before we leave today.

## 2020-03-02 NOTE — Telephone Encounter (Signed)
Pt had a virtual with Burchette this week about constipation but she is still having pain issues. Pt believes she thinks she needs a urine test to see if she is having an infection and needs to talk him about the issues she is having.   Pt is set up for an appt on Monday 3/15 at 9:15 am but she wants to know if she can go head and provide a urine sample today in order to get this started?   Pt can be reached at [phone]

## 2020-03-02 NOTE — Telephone Encounter (Signed)
Agree with advice.  Will see Monday unless she has worsening symptoms and needs to go to ER

## 2020-03-05 ENCOUNTER — Encounter: Payer: Self-pay | Admitting: Family Medicine

## 2020-03-05 ENCOUNTER — Other Ambulatory Visit: Payer: Self-pay

## 2020-03-05 ENCOUNTER — Ambulatory Visit (INDEPENDENT_AMBULATORY_CARE_PROVIDER_SITE_OTHER): Payer: PPO | Admitting: Family Medicine

## 2020-03-05 VITALS — BP 122/74 | HR 85 | Temp 97.8°F | Wt 252.8 lb

## 2020-03-05 DIAGNOSIS — R350 Frequency of micturition: Secondary | ICD-10-CM

## 2020-03-05 LAB — POCT URINALYSIS DIPSTICK
Blood, UA: NEGATIVE
Glucose, UA: POSITIVE — AB
Ketones, UA: 5
Leukocytes, UA: NEGATIVE
Nitrite, UA: NEGATIVE
Protein, UA: POSITIVE — AB
Spec Grav, UA: 1.025 (ref 1.010–1.025)
Urobilinogen, UA: 1 E.U./dL
pH, UA: 6 (ref 5.0–8.0)

## 2020-03-05 NOTE — Progress Notes (Signed)
  Subjective:     Patient ID: Heather Oneal, female   DOB: 1941/06/11, 79 y.o.   MRN: [id number]  HPI Heather Oneal had a recent virtual visit.  She was complaining some constipation.  We started MiraLAX and she has had good success with that.  She came in today requesting her urine be checked.  She initially had some mild frequency but really none now.  No burning with urination.  No hematuria.  No flank pain.  No fevers or chills.  She recently made some dietary changes to try to restrict high glycemic foods and wonders if some of her dietary changes led to her constipation.  Past Medical History:  Diagnosis Date  . Allergy    seasonal  . Anemia    as a teenager  . Anxiety   . Depression   . DEPRESSION 01/28/2010  . History of IBS   . Hyperlipidemia   . HYPERLIPIDEMIA 01/28/2010   Past Surgical History:  Procedure Laterality Date  . APPENDECTOMY  1973  . CHOLECYSTECTOMY  2008  . COLONOSCOPY    . OVARIAN CYST SURGERY  1973   ruptured ovarian cyst  . POLYPECTOMY    . TONSILLECTOMY  1948    reports that she has never smoked. She has never used smokeless tobacco. She reports current alcohol use. She reports that she does not use drugs. family history includes Cancer (age of onset: 27) in her sister; Crohn's disease in her brother; Diabetes in her maternal grandmother; Heart disease in her mother and paternal grandfather; Hypertension in her mother; Stomach cancer in her paternal grandmother. Allergies  Allergen Reactions  . Crestor [Rosuvastatin]     " Ears itching and throat burning"     Review of Systems  Constitutional: Negative for appetite change, chills, fever and unexpected weight change.  Gastrointestinal: Negative for abdominal pain, constipation, diarrhea, nausea and vomiting.  Genitourinary: Positive for frequency. Negative for dysuria.  Musculoskeletal: Negative for back pain.  Neurological: Negative for dizziness.       Objective:   Physical Exam Vitals reviewed.   Constitutional:      Appearance: Normal appearance.  Cardiovascular:     Rate and Rhythm: Normal rate and regular rhythm.  Pulmonary:     Effort: Pulmonary effort is normal.     Breath sounds: Normal breath sounds.  Neurological:     Mental Status: She is alert.        Assessment:     Mild dysuria and frequency.  Urine dipstick reveals concentrated urine but negative for nitrites, leukocytes, and blood.  No evidence for UTI    Plan:     -Continue with plenty of fluids and adequate fiber and as needed use of MiraLAX.  Eulas Post MD [facility] at [facility]

## 2020-03-05 NOTE — Patient Instructions (Signed)
Continue plenty of fluids  Try to aim for 25 g or more of fiber per day from diet  Continue MiraLAX as needed

## 2020-03-05 NOTE — Telephone Encounter (Signed)
Pt was seen in office.

## 2020-03-09 ENCOUNTER — Telehealth: Payer: Self-pay | Admitting: Family Medicine

## 2020-03-09 NOTE — Progress Notes (Signed)
°  Chronic Care Management   Outreach Note  03/09/2020 Name: Heather Oneal MRN: [id number] DOB: [birth year]  Referred by: Eulas Post, MD Reason for referral : No chief complaint on file.   An unsuccessful telephone outreach was attempted today. The patient was referred to the pharmacist for assistance with care management and care coordination.   Follow Up Plan:    Chronic Care Management   Outreach Note  03/09/2020 Name: Heather Oneal MRN: [id number] DOB: 02/12/1941  Referred by: Eulas Post, MD Reason for referral : No chief complaint on file.   An unsuccessful telephone outreach was attempted today. The patient was referred to the pharmacist for assistance with care management and care coordination.   Follow Up Plan:   [name] [employer] Scheduler

## 2020-03-09 NOTE — Progress Notes (Signed)
°  Chronic Care Management   Note  03/09/2020 Name: DYLANN CARTRIGHT MRN: [id number] DOB: 1941/09/08  TEAGUE POSTEN is a 79 y.o. year old female who is a primary care patient of Burchette, Alinda Sierras, MD. I reached out to [name] by phone today in response to a referral sent by Ms. Burnis Medin Milles's PCP, Eulas Post, MD.   Ms. Sproull was given information about Chronic Care Management services today including:  1. CCM service includes personalized support from designated clinical staff supervised by her physician, including individualized plan of care and coordination with other care providers 2. 24/7 contact phone numbers for assistance for urgent and routine care needs. 3. Service will only be billed when office clinical staff spend 20 minutes or more in a month to coordinate care. 4. Only one practitioner may furnish and bill the service in a calendar month. 5. The patient may stop CCM services at any time (effective at the end of the month) by phone call to the office staff.   Patient agreed to services and verbal consent obtained.   Follow up plan:   [name] [employer] Scheduler

## 2020-03-20 ENCOUNTER — Ambulatory Visit: Payer: PPO | Attending: Internal Medicine

## 2020-03-20 DIAGNOSIS — Z23 Encounter for immunization: Secondary | ICD-10-CM

## 2020-03-20 NOTE — Progress Notes (Signed)
   Covid-19 Vaccination Clinic  Name:  Heather Oneal    MRN: [id number] DOB: Jun 06, 1941  03/20/2020  [name] was observed post Covid-19 immunization for 15 minutes without incident. She was provided with Vaccine Information Sheet and instruction to access the V-Safe system.   [name] was instructed to call 911 with any severe reactions post vaccine: Marland Kitchen Difficulty breathing  . Swelling of face and throat  . A fast heartbeat  . A bad rash all over body  . Dizziness and weakness   Immunizations Administered    Name Date Dose VIS Date Route   Pfizer COVID-19 Vaccine 03/20/2020 11:15 AM 0.3 mL 12/02/2019 Intramuscular   Manufacturer: [employer]   Lot: [id number]   [place]: [id number]

## 2020-03-26 ENCOUNTER — Other Ambulatory Visit: Payer: Self-pay

## 2020-03-26 DIAGNOSIS — E785 Hyperlipidemia, unspecified: Secondary | ICD-10-CM

## 2020-03-26 DIAGNOSIS — J309 Allergic rhinitis, unspecified: Secondary | ICD-10-CM

## 2020-03-28 ENCOUNTER — Other Ambulatory Visit: Payer: Self-pay

## 2020-03-28 ENCOUNTER — Ambulatory Visit: Payer: PPO

## 2020-03-28 DIAGNOSIS — E785 Hyperlipidemia, unspecified: Secondary | ICD-10-CM

## 2020-03-28 DIAGNOSIS — J309 Allergic rhinitis, unspecified: Secondary | ICD-10-CM

## 2020-03-28 NOTE — Chronic Care Management (AMB) (Signed)
Chronic Care Management Pharmacy  Name: Heather Oneal  MRN: [id number] DOB: 10/30/1941  Initial Questions: 1. Have you seen any other providers since your last visit? NA 2. Any changes in your medicines or health? No   Chief Complaint/ HPI  Heather Oneal,  79 y.o. , female presents for their Initial CCM visit with the clinical pharmacist via telephone due to COVID-19 Pandemic.  PCP : Eulas Post, MD  Their chronic conditions include: HLD, allergic rhinitis  Office Visits: 03/05/2020- Patient presented for office visit with Dr. Carolann Littler, MD requesting for urine be checked. BP: 122/74 mmHg. Patient reported mild frequency and now none. Patient to continue drinking plenty of fluids and adequate fiber.   02/29/2020- Patient presented for virtual visit with Dr. Elease Hashimoto, MD for chief complaint of constipation. Patient was recommended to follow a low FODMAP's diet, increase fluids, and to continue Miralax as needed.   Medications: Outpatient Encounter Medications as of 03/28/2020  Medication Sig Note  . co-enzyme Q-10 30 MG capsule Take by mouth daily.   . Cyanocobalamin (VITAMIN B-12 PO) Take 1 tablet by mouth as directed.   Marland Kitchen guaiFENesin (MUCINEX PO) Take by mouth. 07/11/2019: Take as needed  . Multiple Vitamin (MULTIVITAMIN) tablet Take 1 tablet by mouth daily.   . Probiotic Product (PROBIOTIC PEARLS) CAPS Take 1 capsule by mouth daily.   Marland Kitchen atorvastatin (LIPITOR) 20 MG tablet Take 1 tablet (20 mg total) by mouth daily. (Patient not taking: Reported on 03/05/2020) 10/14/2019: Not started yet   . cetirizine (ZYRTEC) 10 MG tablet Take 10 mg by mouth daily.   . fexofenadine (ALLEGRA) 180 MG tablet Take 180 mg by mouth daily. Reported on 06/18/2016   . fluticasone (FLONASE) 50 MCG/ACT nasal spray Place 1 spray into both nostrils daily. (Patient not taking: Reported on 03/28/2020)   . montelukast (SINGULAIR) 10 MG tablet Take 1 tablet (10 mg total) by mouth at bedtime. (Patient  not taking: Reported on 03/28/2020)   . olopatadine (PATANOL) 0.1 % ophthalmic solution Place 1 drop into both eyes 2 (two) times daily as needed for allergies. (Patient not taking: Reported on 03/28/2020)   . tiZANidine (ZANAFLEX) 4 MG tablet Take 1 tablet (4 mg total) by mouth every 6 (six) hours as needed for muscle spasms. (Patient not taking: Reported on 03/28/2020)    No facility-administered encounter medications on file as of 03/28/2020.     Current Diagnosis/Assessment:  Goals Addressed            This Visit's Progress   . Pharmacy Care Plan       CARE PLAN ENTRY  Current Barriers:  . Chronic Disease Management support, education, and care coordination needs related to HLD and Allergic rhinitis   Pharmacist Clinical Goal(s):  Marland Kitchen Over next 6 months, patient will work on:   . High cholesterol . Cholesterol goals: Total Cholesterol goal under 200, Triglycerides goal under 150, HDL goal above 40 (men) or above 50 (women), LDL goal under 100.  Lipid Panel     Component Value Date/Time   CHOL 277 (H) 10/07/2019 1114   TRIG 236.0 (H) 10/07/2019 1114   HDL 45.40 10/07/2019 1114   CHOLHDL 6 10/07/2019 1114   VLDL 47.2 (H) 10/07/2019 1114   LDLCALC 137 (H) 02/08/2018 1137   LDLDIRECT 185.0 10/07/2019 1114   . Allergies . Improve symptoms associated with allergies.   Interventions: . Comprehensive medication review performed. . High cholesterol . How to reduce cholesterol through diet/weight management and physical  activity.    . We discussed how a diet high in plant sterols (fruits/vegetables/nuts/whole grains/legumes) may reduce your cholesterol.  Encouraged increasing fiber to a daily intake of 10-25g/day  . Continue: atorvastatin 20mg , 1 tablet once daily  . Allergic rhinitis . Continue:  - guaifenesin (Mucinex) as directed - cetirizine (Zyrtec) 10mg , 1 tablet once daily - fluticasone (Flonase) 77mcg/ act nasal spray, 1 spray into both nostrils daily  - olopatadine  (Patanol) 0.1% solution, place 1 drop into both eye twice daily as needed for allergies   Patient Self Care Activities:  . Self administers medications as prescribed, Calls pharmacy for medication refills, and Calls provider office for new concerns or questions  Initial goal documentation       SDOH Interventions     Most Recent Value  SDOH Interventions  SDOH Interventions for the Following Domains  Financial Strain  Financial Strain Interventions  Other (Comment) [Not needed]       Hyperlipidemia  Lipid Panel     Component Value Date/Time   CHOL 277 (H) 10/07/2019 1114   TRIG 236.0 (H) 10/07/2019 1114   HDL 45.40 10/07/2019 1114   CHOLHDL 6 10/07/2019 1114   VLDL 47.2 (H) 10/07/2019 1114   LDLCALC 137 (H) 02/08/2018 1137   LDLDIRECT 185.0 10/07/2019 1114    The 10-year ASCVD risk score Mikey Bussing DC Jr., et al., 2013) is: 19.4%   Values used to calculate the score:     Age: 89 years     Sex: Female     Is Non-Hispanic African American: No     Diabetic: No     Tobacco smoker: No     Systolic Blood Pressure: 123XX123 mmHg     Is BP treated: No     HDL Cholesterol: 45.4 mg/dL     Total Cholesterol: 277 mg/dL   Patient has failed these meds in past:  Crestor (ears itching/ burning) Patient is currently uncontrolled on the following medications:  - atorvastatin 20mg , 1 tablet once daily    We discussed:  diet and exercise extensively . How to reduce cholesterol through diet/weight management and physical activity.    . We discussed how a diet high in plant sterols (fruits/vegetables/nuts/whole grains/legumes) may reduce your cholesterol.  Encouraged increasing fiber to a daily intake of 10-25g/day  Plan Patient reported not taking; no specific. Denied ADRs (myalgia). Upon discussion, patient plans to restart.    Continue current medications  Allergic Rhinitis   Patient has failed these meds in past:montelukast  Patient is currently controlled on the following medications:    - guaifenesin (Mucinex)  Plans on restarting the following since allergies on starting again:  - cetirizine (Zyrtec) 10mg , 1 tablet once daily - fluticasone (Flonase) 21mcg/ act nasal spray, 1 spray into both nostrils daily  - olopatadine (Patanol) 0.1% solution, place 1 drop into both eye twice daily as needed for allergies    Plan Continue current medications   Medication Management  Adherence:   Atorvastatin 20mg , last filled 10/10/2019, 60DS (discussed with patient- she plans to restart; she states no specific reason why she stopped taking it).    Follow up Will conduct general telephone calls for periodic check-ins before next visit and will schedule follow up visit then.     Anson Crofts, PharmD Clinical Pharmacist [facility] at [place] [phone]

## 2020-03-30 NOTE — Patient Instructions (Addendum)
Visit Information  Goals Addressed            This Visit's Progress   . Pharmacy Care Plan       CARE PLAN ENTRY  Current Barriers:  . Chronic Disease Management support, education, and care coordination needs related to HLD and Allergic rhinitis   Pharmacist Clinical Goal(s):  Marland Kitchen Over next 6 months, patient will work on:   . High cholesterol . Cholesterol goals: Total Cholesterol goal under 200, Triglycerides goal under 150, HDL goal above 40 (men) or above 50 (women), LDL goal under 100.  Lipid Panel     Component Value Date/Time   CHOL 277 (H) 10/07/2019 1114   TRIG 236.0 (H) 10/07/2019 1114   HDL 45.40 10/07/2019 1114   CHOLHDL 6 10/07/2019 1114   VLDL 47.2 (H) 10/07/2019 1114   LDLCALC 137 (H) 02/08/2018 1137   LDLDIRECT 185.0 10/07/2019 1114   . Allergies . Improve symptoms associated with allergies.   Interventions: . Comprehensive medication review performed. . High cholesterol . How to reduce cholesterol through diet/weight management and physical activity.    . We discussed how a diet high in plant sterols (fruits/vegetables/nuts/whole grains/legumes) may reduce your cholesterol.  Encouraged increasing fiber to a daily intake of 10-25g/day  . Continue: atorvastatin 20mg , 1 tablet once daily  . Allergic rhinitis . Continue:  - guaifenesin (Mucinex) as directed - cetirizine (Zyrtec) 10mg , 1 tablet once daily - fluticasone (Flonase) 54mcg/ act nasal spray, 1 spray into both nostrils daily  - olopatadine (Patanol) 0.1% solution, place 1 drop into both eye twice daily as needed for allergies   Patient Self Care Activities:  . Self administers medications as prescribed, Calls pharmacy for medication refills, and Calls provider office for new concerns or questions  Initial goal documentation        Heather Oneal was given information about Chronic Care Management services today including:  1. CCM service includes personalized support from designated clinical staff  supervised by her physician, including individualized plan of care and coordination with other care providers 2. 24/7 contact phone numbers for assistance for urgent and routine care needs. 3. Standard insurance, coinsurance, copays and deductibles apply for chronic care management only during months in which we provide at least 20 minutes of these services. Most insurances cover these services at 100%, however patients may be responsible for any copay, coinsurance and/or deductible if applicable. This service may help you avoid the need for more expensive face-to-face services. 4. Only one practitioner may furnish and bill the service in a calendar month. 5. The patient may stop CCM services at any time (effective at the end of the month) by phone call to the office staff.  Patient agreed to services and verbal consent obtained.   The patient verbalized understanding of instructions provided today and agreed to receive a mailed copy of patient instruction and/or educational materials. The pharmacy team will reach out to the patient again over the next 6 months.    Anson Crofts, PharmD Clinical Pharmacist [facility] [phone]    High Cholesterol  High cholesterol is a condition in which the blood has high levels of a white, waxy, fat-like substance (cholesterol). The human body needs small amounts of cholesterol. The liver makes all the cholesterol that the body needs. Extra (excess) cholesterol comes from the food that we eat. Cholesterol is carried from the liver by the blood through the blood vessels. If you have high cholesterol, deposits (plaques) may  build up on the walls of your blood vessels (arteries). Plaques make the arteries narrower and stiffer. Cholesterol plaques increase your risk for heart attack and stroke. Work with your health care provider to keep your cholesterol levels in a healthy range. What increases the risk? This condition is more  likely to develop in people who:  Eat foods that are high in animal fat (saturated fat) or cholesterol.  Are overweight.  Are not getting enough exercise.  Have a family history of high cholesterol. What are the signs or symptoms? There are no symptoms of this condition. How is this diagnosed? This condition may be diagnosed from the results of a blood test.  If you are older than age 67, your health care provider may check your cholesterol every 4-6 years.  You may be checked more often if you already have high cholesterol or other risk factors for heart disease. The blood test for cholesterol measures:  "Bad" cholesterol (LDL cholesterol). This is the main type of cholesterol that causes heart disease. The desired level for LDL is less than 100.  "Good" cholesterol (HDL cholesterol). This type helps to protect against heart disease by cleaning the arteries and carrying the LDL away. The desired level for HDL is 60 or higher.  Triglycerides. These are fats that the body can store or burn for energy. The desired number for triglycerides is lower than 150.  Total cholesterol. This is a measure of the total amount of cholesterol in your blood, including LDL cholesterol, HDL cholesterol, and triglycerides. A healthy number is less than 200. How is this treated? This condition is treated with diet changes, lifestyle changes, and medicines. Diet changes  This may include eating more whole grains, fruits, vegetables, nuts, and fish.  This may also include cutting back on red meat and foods that have a lot of added sugar. Lifestyle changes  Changes may include getting at least 40 minutes of aerobic exercise 3 times a week. Aerobic exercises include walking, biking, and swimming. Aerobic exercise along with a healthy diet can help you maintain a healthy weight.  Changes may also include quitting smoking. Medicines  Medicines are usually given if diet and lifestyle changes have failed  to reduce your cholesterol to healthy levels.  Your health care provider may prescribe a statin medicine. Statin medicines have been shown to reduce cholesterol, which can reduce the risk of heart disease. Follow these instructions at home: Eating and drinking If told by your health care provider:  Eat chicken (without skin), fish, veal, shellfish, ground [place] breast, and round or loin cuts of red meat.  Do not eat fried foods or fatty meats, such as hot dogs and salami.  Eat plenty of fruits, such as apples.  Eat plenty of vegetables, such as broccoli, potatoes, and carrots.  Eat beans, peas, and lentils.  Eat grains such as barley, rice, couscous, and bulgur wheat.  Eat pasta without cream sauces.  Use skim or nonfat milk, and eat low-fat or nonfat yogurt and cheeses.  Do not eat or drink whole milk, cream, ice cream, egg yolks, or hard cheeses.  Do not eat stick margarine or tub margarines that contain trans fats (also called partially hydrogenated oils).  Do not eat saturated tropical oils, such as coconut oil and palm oil.  Do not eat cakes, cookies, crackers, or other baked goods that contain trans fats.  General instructions  Exercise as directed by your health care provider. Increase your activity level with activities such as  gardening, walking, and taking the stairs.  Take over-the-counter and prescription medicines only as told by your health care provider.  Do not use any products that contain nicotine or tobacco, such as cigarettes and e-cigarettes. If you need help quitting, ask your health care provider.  Keep all follow-up visits as told by your health care provider. This is important. Contact a health care provider if:  You are struggling to maintain a healthy diet or weight.  You need help to start on an exercise program.  You need help to stop smoking. Get help right away if:  You have chest pain.  You have trouble breathing. This information  is not intended to replace advice given to you by your health care provider. Make sure you discuss any questions you have with your health care provider. Document Revised: 12/11/2017 Document Reviewed: 06/07/2016 Elsevier Patient Education  [place].

## 2020-05-22 ENCOUNTER — Other Ambulatory Visit: Payer: Self-pay

## 2020-05-22 ENCOUNTER — Telehealth (INDEPENDENT_AMBULATORY_CARE_PROVIDER_SITE_OTHER): Payer: PPO | Admitting: Family Medicine

## 2020-05-22 DIAGNOSIS — R1031 Right lower quadrant pain: Secondary | ICD-10-CM

## 2020-05-22 DIAGNOSIS — K589 Irritable bowel syndrome without diarrhea: Secondary | ICD-10-CM | POA: Diagnosis not present

## 2020-05-22 MED ORDER — DICYCLOMINE HCL 10 MG PO CAPS: 10.0000 mg | ORAL_CAPSULE | Freq: Three times a day (TID) | ORAL | 1 refills | Status: DC | PRN

## 2020-05-22 NOTE — Progress Notes (Signed)
Patient ID: Heather Oneal, female   DOB: Feb 16, 1941, 79 y.o.   MRN: [id number]  This visit type was conducted due to national recommendations for restrictions regarding the COVID-19 pandemic in an effort to limit this patient's exposure and mitigate transmission in our community.   Virtual Visit via Telephone Note  I connected with Heather Oneal on 05/22/20 at 11:00 AM EDT by telephone and verified that I am speaking with the correct person using two identifiers.   I discussed the limitations, risks, security and privacy concerns of performing an evaluation and management service by telephone and the availability of in person appointments. I also discussed with the patient that there may be a patient responsible charge related to this service. The patient expressed understanding and agreed to proceed.  Location patient: home Location provider: work or home office Participants present for the call: patient, provider Patient did not have a visit in the prior 7 days to address this/these issue(s).   History of Present Illness: Heather Oneal called with past couple weeks some increased constipation symptoms.  She is also has some cramp-like pains lower abdomen mostly right-sided.  Her pains are somewhat intermittent.  She has history of reported IBS.  She has had both constipation features and occasionally loose stools.  Her stools are actually slightly looser today.  She has frequent belching.  She thinks she has frequent gas trapping.  She has not any recent nausea or vomiting.  No appetite or weight changes.  No bloody stools.  Last colonoscopy was 10/20 and this was reviewed.  She had diverticular changes sigmoid colon but no mention of right-sided diverticulosis.  She had internal hemorrhoids and some polyps.  She states her appendix has been removed from prior ovary surgery.  Past Medical History:  Diagnosis Date  . Allergy    seasonal  . Anemia    as a teenager  . Anxiety   . Depression    . DEPRESSION 01/28/2010  . History of IBS   . Hyperlipidemia   . HYPERLIPIDEMIA 01/28/2010   Past Surgical History:  Procedure Laterality Date  . APPENDECTOMY  1973  . CHOLECYSTECTOMY  2008  . COLONOSCOPY    . OVARIAN CYST SURGERY  1973   ruptured ovarian cyst  . POLYPECTOMY    . TONSILLECTOMY  1948    reports that she has never smoked. She has never used smokeless tobacco. She reports current alcohol use. She reports that she does not use drugs. family history includes Cancer (age of onset: 69) in her sister; Crohn's disease in her brother; Diabetes in her maternal grandmother; Heart disease in her mother and paternal grandfather; Hypertension in her mother; Stomach cancer in her paternal grandmother. Allergies  Allergen Reactions  . Crestor [Rosuvastatin]     " Ears itching and throat burning"      Observations/Objective: Patient sounds cheerful and well on the phone. I do not appreciate any SOB. Speech and thought processing are grossly intact. Patient reported vitals:  Assessment and Plan:  Chronic abdominal complaints.  She has had history of some alternating constipation and diarrhea/loose stools and probably has IBS.  She denies any current red flags such as appetite change, weight loss, bloody stools, fever.  Her cramp-like pains are intermittent.  -Continue high-fiber diet and plenty of fluids -We discussed trial of low-dose dicyclomine 10 mg 1 every 8 hours as needed -Follow-up immediately for any fever, recurrent vomiting, progressive pain, or any other new concerns  Follow Up Instructions:  -As above.  If pain persist we recommend in office follow-up to check CBC, urinalysis, and good abdominal exam   99441 5-10 99442 11-20 99443 21-30 I did not refer this patient for an OV in the next 24 hours for this/these issue(s).  I discussed the assessment and treatment plan with the patient. The patient was provided an opportunity to ask questions and all were  answered. The patient agreed with the plan and demonstrated an understanding of the instructions.   The patient was advised to call back or seek an in-person evaluation if the symptoms worsen or if the condition fails to improve as anticipated.  I provided 25 minutes of non-face-to-face time during this encounter.   Carolann Littler, MD

## 2020-07-30 ENCOUNTER — Telehealth: Payer: Self-pay | Admitting: Family Medicine

## 2020-07-30 NOTE — Telephone Encounter (Signed)
Left message for patient to schedule Annual Wellness Visit.  Please schedule with Nurse Health Advisor Shannon Crews, RN at [facility]  

## 2020-09-18 ENCOUNTER — Telehealth: Payer: Self-pay | Admitting: Family Medicine

## 2020-09-18 NOTE — Telephone Encounter (Signed)
Left message for patient to schedule Annual Wellness Visit.  Please schedule with Nurse Health Advisor Shannon Crews, RN at [facility]  

## 2020-09-24 ENCOUNTER — Telehealth: Payer: Self-pay | Admitting: Family Medicine

## 2020-09-24 NOTE — Telephone Encounter (Signed)
Left message for patient to schedule Annual Wellness Visit.  Please schedule with Nurse Health Advisor Shannon Crews, RN at [facility]  

## 2020-10-09 ENCOUNTER — Encounter: Payer: PPO | Admitting: Family Medicine

## 2020-10-24 ENCOUNTER — Ambulatory Visit (INDEPENDENT_AMBULATORY_CARE_PROVIDER_SITE_OTHER): Payer: PPO | Admitting: Family Medicine

## 2020-10-24 ENCOUNTER — Encounter: Payer: Self-pay | Admitting: Family Medicine

## 2020-10-24 ENCOUNTER — Other Ambulatory Visit: Payer: Self-pay

## 2020-10-24 VITALS — BP 112/68 | HR 79 | Temp 98.0°F | Ht 67.0 in | Wt 249.0 lb

## 2020-10-24 DIAGNOSIS — Z0000 Encounter for general adult medical examination without abnormal findings: Secondary | ICD-10-CM

## 2020-10-24 DIAGNOSIS — Z780 Asymptomatic menopausal state: Secondary | ICD-10-CM

## 2020-10-24 NOTE — Progress Notes (Signed)
Established Patient Office Visit  Subjective:  Patient ID: Heather Oneal, female    DOB: 1941-03-30  Age: 79 y.o. MRN: [id number]  CC:  Chief Complaint  Patient presents with   Annual Exam    HPI Heather Oneal presents for physical exam.  She has history of obesity, IBS, hyperlipidemia, perennial allergies.  She lives at home with her husband who has advancing Parkinson's disease.  He is becoming more disabled over time.  She is generally doing well with no specific complaints today.  She still sees GYN and has gotten mammograms through their office.  Health maintenance reviewed:  Health Maintenance  Topic Date Due   DEXA SCAN  Never done   INFLUENZA VACCINE  03/21/2021 (Originally 07/22/2020)   Hepatitis C Screening  10/24/2021 (Originally 1941-02-08)   TETANUS/TDAP  11/24/2023   COLONOSCOPY  10/13/2024   COVID-19 Vaccine  Completed   PNA vac Low Risk Adult  Completed   Family history and social history reviewed.  She lives with her husband who has Parkinson's disease.  She has 3 children.  Two live here in the states that she has a daughter that lives in [place].  Never smoked.   No alcohol.   Past Medical History:  Diagnosis Date   Allergy    seasonal   Anemia    as a teenager   Anxiety    Depression    DEPRESSION 01/28/2010   History of IBS    Hyperlipidemia    HYPERLIPIDEMIA 01/28/2010    Past Surgical History:  Procedure Laterality Date   APPENDECTOMY  1973   CHOLECYSTECTOMY  2008   COLONOSCOPY     OVARIAN CYST SURGERY  1973   ruptured ovarian cyst   POLYPECTOMY     TONSILLECTOMY  1948    Family History  Problem Relation Age of Onset   Heart disease Mother    Hypertension Mother    Cancer Sister 104       breast   Crohn's disease Brother    Diabetes Maternal Grandmother    Heart disease Paternal Grandfather    Stomach cancer Paternal Grandmother    Colon cancer Neg Hx    Pancreatic cancer Neg Hx    Rectal cancer  Neg Hx    Esophageal cancer Neg Hx    Colon polyps Neg Hx     Social History   Socioeconomic History   Marital status: Married    Spouse name: Not on file   Number of children: Not on file   Years of education: Not on file   Highest education level: Not on file  Occupational History   Not on file  Tobacco Use   Smoking status: Never Smoker   Smokeless tobacco: Never Used  Vaping Use   Vaping Use: Never used  Substance and Sexual Activity   Alcohol use: Yes    Comment: rarely   Drug use: No   Sexual activity: Not on file  Other Topics Concern   Not on file  Social History Narrative   Not on file   Social Determinants of Health   Financial Resource Strain: Low Risk    Difficulty of Paying Living Expenses: Not hard at all  Food Insecurity:    Worried About Charity fundraiser in the Last Year: Not on file   [employer] of Food in the Last Year: Not on file  Transportation Needs:    Lack of Transportation (Medical): Not on file   Lack of Transportation (  Non-Medical): Not on file  Physical Activity:    Days of Exercise per Week: Not on file   Minutes of Exercise per Session: Not on file  Stress:    Feeling of Stress : Not on file  Social Connections:    Frequency of Communication with Friends and Family: Not on file   Frequency of Social Gatherings with Friends and Family: Not on file   Attends Religious Services: Not on file   Active Member of Clubs or Organizations: Not on file   Attends Archivist Meetings: Not on file   Marital Status: Not on file  Intimate Partner Violence:    Fear of Current or Ex-Partner: Not on file   Emotionally Abused: Not on file   Physically Abused: Not on file   Sexually Abused: Not on file    Outpatient Medications Prior to Visit  Medication Sig Dispense Refill   guaiFENesin (MUCINEX PO) Take by mouth.     Multiple Vitamin (MULTIVITAMIN) tablet Take 1 tablet by mouth daily.      olopatadine (PATANOL) 0.1 % ophthalmic solution Place 1 drop into both eyes 2 (two) times daily as needed for allergies. 5 mL 12   atorvastatin (LIPITOR) 20 MG tablet Take 1 tablet (20 mg total) by mouth daily. (Patient not taking: Reported on 03/05/2020) 60 tablet 0   cetirizine (ZYRTEC) 10 MG tablet Take 10 mg by mouth daily. (Patient not taking: Reported on 10/24/2020)     co-enzyme Q-10 30 MG capsule Take by mouth daily. (Patient not taking: Reported on 10/24/2020)     Cyanocobalamin (VITAMIN B-12 PO) Take 1 tablet by mouth as directed. (Patient not taking: Reported on 10/24/2020)     dicyclomine (BENTYL) 10 MG capsule Take 1 capsule (10 mg total) by mouth 3 (three) times daily as needed for spasms. (Patient not taking: Reported on 10/24/2020) 30 capsule 1   fexofenadine (ALLEGRA) 180 MG tablet Take 180 mg by mouth daily. Reported on 06/18/2016 (Patient not taking: Reported on 10/24/2020)     fluticasone (FLONASE) 50 MCG/ACT nasal spray Place 1 spray into both nostrils daily. (Patient not taking: Reported on 03/28/2020) 16 g 2   montelukast (SINGULAIR) 10 MG tablet Take 1 tablet (10 mg total) by mouth at bedtime. (Patient not taking: Reported on 03/28/2020) 90 tablet 1   Probiotic Product (PROBIOTIC PEARLS) CAPS Take 1 capsule by mouth daily. (Patient not taking: Reported on 10/24/2020)     No facility-administered medications prior to visit.    Allergies  Allergen Reactions   Crestor [Rosuvastatin]     " Ears itching and throat burning"    ROS Review of Systems  Constitutional: Negative for fatigue.  Eyes: Negative for visual disturbance.  Respiratory: Negative for cough, chest tightness, shortness of breath and wheezing.   Cardiovascular: Negative for chest pain, palpitations and leg swelling.  Neurological: Negative for dizziness, seizures, syncope, weakness, light-headedness and headaches.      Objective:    Physical Exam Vitals reviewed.  Constitutional:      Appearance:  Normal appearance.  HENT:     Right Ear: Tympanic membrane and ear canal normal.     Left Ear: Tympanic membrane and ear canal normal.  Cardiovascular:     Rate and Rhythm: Normal rate and regular rhythm.  Pulmonary:     Effort: Pulmonary effort is normal.     Breath sounds: Normal breath sounds.  Abdominal:     Palpations: Abdomen is soft.     Tenderness: There is no  abdominal tenderness. There is no guarding or rebound.  Musculoskeletal:     Right lower leg: No edema.     Left lower leg: No edema.  Neurological:     General: No focal deficit present.     Mental Status: She is alert and oriented to person, place, and time.     BP 112/68 (BP Location: Left Arm, Patient Position: Sitting, Cuff Size: Large)    Pulse 79    Temp 98 F (36.7 C)    Ht 5\' 7"  (1.702 m)    Wt 249 lb (112.9 kg)    SpO2 94%    BMI 39.00 kg/m  Wt Readings from Last 3 Encounters:  10/24/20 249 lb (112.9 kg)  03/05/20 252 lb 12.8 oz (114.7 kg)  10/14/19 255 lb (115.7 kg)     Health Maintenance Due  Topic Date Due   DEXA SCAN  Never done    There are no preventive care reminders to display for this patient.  Lab Results  Component Value Date   TSH 2.57 07/20/2019   Lab Results  Component Value Date   WBC 6.9 07/20/2019   HGB 13.0 07/20/2019   HCT 40.1 07/20/2019   MCV 88.6 07/20/2019   PLT 339.0 07/20/2019   Lab Results  Component Value Date   NA 139 07/20/2019   K 4.6 07/20/2019   CO2 29 07/20/2019   GLUCOSE 93 07/20/2019   BUN 15 07/20/2019   CREATININE 0.75 07/20/2019   BILITOT 0.9 07/20/2019   ALKPHOS 66 07/20/2019   AST 17 07/20/2019   ALT 18 07/20/2019   PROT 6.9 07/20/2019   ALBUMIN 4.2 07/20/2019   CALCIUM 9.3 07/20/2019   GFR 74.78 07/20/2019   Lab Results  Component Value Date   CHOL 277 (H) 10/07/2019   Lab Results  Component Value Date   HDL 45.40 10/07/2019   Lab Results  Component Value Date   LDLCALC 137 (H) 02/08/2018   Lab Results  Component Value  Date   TRIG 236.0 (H) 10/07/2019   Lab Results  Component Value Date   CHOLHDL 6 10/07/2019   No results found for: HGBA1C    Assessment & Plan:   Problem List Items Addressed This Visit    None    Visit Diagnoses    Physical exam    -  Primary   Relevant Orders   Basic metabolic panel   Lipid panel   CBC with Differential/Platelet   TSH   Hepatic function panel   Postmenopausal       Relevant Orders   DG Bone Density    - Recommend continued annual flu vaccine -we discussed hepatitis C screening but she is low risk and she declines. -We have no documentation of prior DEXA scan.  This was ordered. -She will discuss with her gynecologist issues regarding screening mammogram -Obtain screening labs as above -He is encouraged to try to lose some weight.  Increase activity as much as possible -Covid vaccine already given.    No orders of the defined types were placed in this encounter.   Follow-up: No follow-ups on file.    Carolann Littler, MD

## 2020-10-24 NOTE — Patient Instructions (Signed)
Set up DEXA scan    This will be done at [facility].   We will call with lab results.  Consider repeat mammogram- discuss with Gyn.

## 2020-10-25 ENCOUNTER — Other Ambulatory Visit: Payer: Self-pay

## 2020-10-25 DIAGNOSIS — J309 Allergic rhinitis, unspecified: Secondary | ICD-10-CM

## 2020-10-25 LAB — CBC WITH DIFFERENTIAL/PLATELET
Absolute Monocytes: 886 cells/uL (ref 200–950)
Basophils Absolute: 43 cells/uL (ref 0–200)
Basophils Relative: 0.6 %
Eosinophils Absolute: 180 cells/uL (ref 15–500)
Eosinophils Relative: 2.5 %
HCT: 39.8 % (ref 35.0–45.0)
Hemoglobin: 13.3 g/dL (ref 11.7–15.5)
Lymphs Abs: 2016 cells/uL (ref 850–3900)
MCH: 29.9 pg (ref 27.0–33.0)
MCHC: 33.4 g/dL (ref 32.0–36.0)
MCV: 89.4 fL (ref 80.0–100.0)
MPV: 9.5 fL (ref 7.5–12.5)
Monocytes Relative: 12.3 %
Neutro Abs: 4075 cells/uL (ref 1500–7800)
Neutrophils Relative %: 56.6 %
Platelets: 330 10*3/uL (ref 140–400)
RBC: 4.45 10*6/uL (ref 3.80–5.10)
RDW: 14.1 % (ref 11.0–15.0)
Total Lymphocyte: 28 %
WBC: 7.2 10*3/uL (ref 3.8–10.8)

## 2020-10-25 LAB — BASIC METABOLIC PANEL
BUN: 15 mg/dL (ref 7–25)
CO2: 30 mmol/L (ref 20–32)
Calcium: 9.7 mg/dL (ref 8.6–10.4)
Chloride: 102 mmol/L (ref 98–110)
Creat: 0.75 mg/dL (ref 0.60–0.93)
Glucose, Bld: 98 mg/dL (ref 65–99)
Potassium: 4.9 mmol/L (ref 3.5–5.3)
Sodium: 139 mmol/L (ref 135–146)

## 2020-10-25 LAB — HEPATIC FUNCTION PANEL
AG Ratio: 1.6 (calc) (ref 1.0–2.5)
ALT: 14 U/L (ref 6–29)
AST: 16 U/L (ref 10–35)
Albumin: 4.1 g/dL (ref 3.6–5.1)
Alkaline phosphatase (APISO): 52 U/L (ref 37–153)
Bilirubin, Direct: 0.1 mg/dL (ref 0.0–0.2)
Globulin: 2.5 g/dL (calc) (ref 1.9–3.7)
Indirect Bilirubin: 0.7 mg/dL (calc) (ref 0.2–1.2)
Total Bilirubin: 0.8 mg/dL (ref 0.2–1.2)
Total Protein: 6.6 g/dL (ref 6.1–8.1)

## 2020-10-25 LAB — TSH: TSH: 3.34 mIU/L (ref 0.40–4.50)

## 2020-10-25 LAB — LIPID PANEL
Cholesterol: 280 mg/dL — ABNORMAL HIGH (ref <200)
HDL: 54 mg/dL (ref >=50)
LDL Cholesterol (Calc): 180 mg/dL (calc) — ABNORMAL HIGH
Non-HDL Cholesterol (Calc): 226 mg/dL (calc) — ABNORMAL HIGH (ref <130)
Total CHOL/HDL Ratio: 5.2 (calc) — ABNORMAL HIGH (ref <5.0)
Triglycerides: 246 mg/dL — ABNORMAL HIGH (ref <150)

## 2020-10-25 MED ORDER — FLUTICASONE PROPIONATE 50 MCG/ACT NA SUSP: 1.0000 | Freq: Every day | NASAL | 0 refills | Status: DC

## 2020-10-25 MED ORDER — ATORVASTATIN CALCIUM 20 MG PO TABS
20.0000 mg | ORAL_TABLET | Freq: Every day | ORAL | 1 refills | Status: DC
Start: 2020-10-25 — End: 2021-02-06

## 2020-11-05 ENCOUNTER — Other Ambulatory Visit: Payer: PPO

## 2020-11-06 ENCOUNTER — Telehealth: Payer: Self-pay | Admitting: Pharmacist

## 2020-11-07 ENCOUNTER — Telehealth: Payer: Self-pay | Admitting: Family Medicine

## 2020-11-07 NOTE — Telephone Encounter (Signed)
Patient is returning call.  °

## 2020-11-07 NOTE — Telephone Encounter (Signed)
Patient is calling back for lab results, please advise. CB is [phone]

## 2020-11-26 NOTE — Progress Notes (Signed)
A user error has taken place: encounter opened in error, closed for administrative reasons.

## 2021-01-15 ENCOUNTER — Other Ambulatory Visit: Payer: Self-pay

## 2021-01-16 ENCOUNTER — Ambulatory Visit (INDEPENDENT_AMBULATORY_CARE_PROVIDER_SITE_OTHER): Payer: PPO

## 2021-01-16 ENCOUNTER — Encounter: Payer: Self-pay | Admitting: Family Medicine

## 2021-01-16 ENCOUNTER — Ambulatory Visit (INDEPENDENT_AMBULATORY_CARE_PROVIDER_SITE_OTHER): Payer: PPO | Admitting: Family Medicine

## 2021-01-16 VITALS — BP 138/88 | HR 75 | Ht 67.0 in | Wt 254.0 lb

## 2021-01-16 DIAGNOSIS — M25561 Pain in right knee: Secondary | ICD-10-CM

## 2021-01-16 NOTE — Patient Instructions (Signed)
Acute Knee Pain, Adult Acute knee pain is sudden and may be caused by damage, swelling, or irritation of the muscles and tissues that support the knee. Pain may result from:  A fall.  An injury to the knee from twisting motions.  A hit to the knee.  Infection. Acute knee pain may go away on its own with time and rest. If it does not, your health care provider may order tests to find the cause of the pain. These may include:  Imaging tests, such as an X-ray, MRI, CT scan, or ultrasound.  Joint aspiration. In this test, fluid is removed from the knee and evaluated.  Arthroscopy. In this test, a lighted tube is inserted into the knee and an image is projected onto a TV screen.  Biopsy. In this test, a sample of tissue is removed from the body and studied under a microscope. Follow these instructions at home: If you have a knee sleeve or brace:  Wear the knee sleeve or brace as told by your health care provider. Remove it only as told by your health care provider.  Loosen it if your toes tingle, become numb, or turn cold and blue.  Keep it clean.  If the knee sleeve or brace is not waterproof: ? Do not let it get wet. ? Cover it with a watertight covering when you take a bath or shower.   Activity  Rest your knee.  Do not do things that cause pain or make pain worse.  Avoid high-impact activities or exercises, such as running, jumping rope, or doing jumping jacks.  Work with a physical therapist to make a safe exercise program, as recommended by your health care provider. Do exercises as told by your physical therapist. Managing pain, stiffness, and swelling  If directed, put ice on the affected knee. To do this: ? If you have a removable knee sleeve or brace, remove it as told by your health care provider. ? Put ice in a plastic bag. ? Place a towel between your skin and the bag. ? Leave the ice on for 20 minutes, 2-3 times a day. ? Remove the ice if your skin turns bright  red. This is very important. If you cannot feel pain, heat, or cold, you have a greater risk of damage to the area.  If directed, use an elastic bandage to put pressure (compression) on your injured knee. This may control swelling, give support, and help with discomfort.  Raise (elevate) your knee above the level of your heart while you are sitting or lying down.  Sleep with a pillow under your knee.   General instructions  Take over-the-counter and prescription medicines only as told by your health care provider.  Do not use any products that contain nicotine or tobacco, such as cigarettes, e-cigarettes, and chewing tobacco. If you need help quitting, ask your health care provider.  If you are overweight, work with your health care provider and a dietitian to set a weight-loss goal that is healthy and reasonable for you. Extra weight can put pressure on your knee.  Pay attention to any changes in your symptoms.  Keep all follow-up visits. This is important. Contact a health care provider if:  Your knee pain continues, changes, or gets worse.  You have a fever along with knee pain.  Your knee feels warm to the touch or is red.  Your knee buckles or locks up. Get help right away if:  Your knee swells, and the swelling becomes   worse.  You cannot move your knee.  You have severe pain in your knee that cannot be managed with pain medicine. Summary  Acute knee pain can be caused by a fall, an injury, an infection, or damage, swelling, or irritation of the tissues that support your knee.  Your health care provider may perform tests to find out the cause of the pain.  Pay attention to any changes in your symptoms. Relieve your pain with rest, medicines, light activity, and the use of ice.  Get help right away if your knee swells, you cannot move your knee, or you have severe pain that cannot be managed with medicine. This information is not intended to replace advice given to you  by your health care provider. Make sure you discuss any questions you have with your health care provider. Document Revised: 05/23/2020 Document Reviewed: 05/23/2020 Elsevier Patient Education  2021 Elsevier Inc.  

## 2021-01-16 NOTE — Progress Notes (Signed)
Established Patient Office Visit  Subjective:  Patient ID: Heather Oneal, female    DOB: 08/13/1941  Age: 80 y.o. MRN: [id number]  CC:  Chief Complaint  Patient presents with  . Leg Pain    Right side    HPI Heather Oneal presents for predominantly right posterior medial knee pain.  Over 2-week duration.  Denies any specific injury.  Her husband has Parkinson's disease and did recently fall and she has had to help him quite a bit.  She notices her pain is worse when going downstairs.  No definite weakness or numbness.  She had occasional pains radiating from her right hip down to her foot but states that this pain which is mostly centered around the knee is much different than her sciatica type symptoms.  She has taken some low-dose Advil without much improvement.  Pain is somewhat intermittent.  Also worse sometimes at night when repositioning in bed.  She is not seeing any bruising.  She apparently has some chronic edema left lower extremity greater than right.  No history of DVT.  Past Medical History:  Diagnosis Date  . Allergy    seasonal  . Anemia    as a teenager  . Anxiety   . Depression   . DEPRESSION 01/28/2010  . History of IBS   . Hyperlipidemia   . HYPERLIPIDEMIA 01/28/2010    Past Surgical History:  Procedure Laterality Date  . APPENDECTOMY  1973  . CHOLECYSTECTOMY  2008  . COLONOSCOPY    . OVARIAN CYST SURGERY  1973   ruptured ovarian cyst  . POLYPECTOMY    . TONSILLECTOMY  1948    Family History  Problem Relation Age of Onset  . Heart disease Mother   . Hypertension Mother   . Cancer Sister 32       breast  . Crohn's disease Brother   . Diabetes Maternal Grandmother   . Heart disease Paternal Grandfather   . Stomach cancer Paternal Grandmother   . Colon cancer Neg Hx   . Pancreatic cancer Neg Hx   . Rectal cancer Neg Hx   . Esophageal cancer Neg Hx   . Colon polyps Neg Hx     Social History   Socioeconomic History  . Marital status:  Married    Spouse name: Not on file  . Number of children: Not on file  . Years of education: Not on file  . Highest education level: Not on file  Occupational History  . Not on file  Tobacco Use  . Smoking status: Never Smoker  . Smokeless tobacco: Never Used  Vaping Use  . Vaping Use: Never used  Substance and Sexual Activity  . Alcohol use: Yes    Comment: rarely  . Drug use: No  . Sexual activity: Not on file  Other Topics Concern  . Not on file  Social History Narrative  . Not on file   Social Determinants of Health   Financial Resource Strain: Low Risk   . Difficulty of Paying Living Expenses: Not hard at all  Food Insecurity: Not on file  Transportation Needs: Not on file  Physical Activity: Not on file  Stress: Not on file  Social Connections: Not on file  Intimate Partner Violence: Not on file    Outpatient Medications Prior to Visit  Medication Sig Dispense Refill  . atorvastatin (LIPITOR) 20 MG tablet Take 1 tablet (20 mg total) by mouth daily. 30 tablet 1  . fluticasone (FLONASE) 50  MCG/ACT nasal spray Place 1 spray into both nostrils daily. 16 g 0  . guaiFENesin (MUCINEX PO) Take by mouth.    . Multiple Vitamin (MULTIVITAMIN) tablet Take 1 tablet by mouth daily.    Marland Kitchen olopatadine (PATANOL) 0.1 % ophthalmic solution Place 1 drop into both eyes 2 (two) times daily as needed for allergies. 5 mL 12  . cetirizine (ZYRTEC) 10 MG tablet Take 10 mg by mouth daily. (Patient not taking: Reported on 10/24/2020)    . co-enzyme Q-10 30 MG capsule Take by mouth daily. (Patient not taking: Reported on 10/24/2020)    . Cyanocobalamin (VITAMIN B-12 PO) Take 1 tablet by mouth as directed. (Patient not taking: No sig reported)    . dicyclomine (BENTYL) 10 MG capsule Take 1 capsule (10 mg total) by mouth 3 (three) times daily as needed for spasms. (Patient not taking: Reported on 10/24/2020) 30 capsule 1  . fexofenadine (ALLEGRA) 180 MG tablet Take 180 mg by mouth daily. Reported on  06/18/2016 (Patient not taking: Reported on 10/24/2020)    . montelukast (SINGULAIR) 10 MG tablet Take 1 tablet (10 mg total) by mouth at bedtime. (Patient not taking: Reported on 03/28/2020) 90 tablet 1  . Probiotic Product (PROBIOTIC PEARLS) CAPS Take 1 capsule by mouth daily. (Patient not taking: Reported on 10/24/2020)     No facility-administered medications prior to visit.    Allergies  Allergen Reactions  . Crestor [Rosuvastatin]     " Ears itching and throat burning"    ROS Review of Systems  Constitutional: Negative for chills and fever.  Musculoskeletal: Negative for back pain.  Skin: Negative for rash.  Neurological: Negative for weakness and numbness.  Hematological: Negative for adenopathy.      Objective:    Physical Exam Vitals reviewed.  Constitutional:      Appearance: She is obese.  Cardiovascular:     Rate and Rhythm: Normal rate and regular rhythm.  Pulmonary:     Effort: Pulmonary effort is normal.     Breath sounds: Normal breath sounds.  Musculoskeletal:     Comments: Right knee reveals full range of motion.  She has some tenderness over the medial joint space.  No lateral tenderness.  No bruising.  No warmth.  No erythema.  No significant effusion.  No calf tenderness.  She has some mild edema left lower extremity greater than right.  Neurological:     Mental Status: She is alert.     BP 138/88   Pulse 75   Ht 5\' 7"  (1.702 m)   Wt 254 lb (115.2 kg)   SpO2 94%   BMI 39.78 kg/m  Wt Readings from Last 3 Encounters:  01/16/21 254 lb (115.2 kg)  10/24/20 249 lb (112.9 kg)  03/05/20 252 lb 12.8 oz (114.7 kg)     Health Maintenance Due  Topic Date Due  . DEXA SCAN  Never done  . COVID-19 Vaccine (3 - Booster for Pfizer series) 09/20/2020    There are no preventive care reminders to display for this patient.  Lab Results  Component Value Date   TSH 3.34 10/24/2020   Lab Results  Component Value Date   WBC 7.2 10/24/2020   HGB 13.3  10/24/2020   HCT 39.8 10/24/2020   MCV 89.4 10/24/2020   PLT 330 10/24/2020   Lab Results  Component Value Date   NA 139 10/24/2020   K 4.9 10/24/2020   CO2 30 10/24/2020   GLUCOSE 98 10/24/2020   BUN 15  10/24/2020   CREATININE 0.75 10/24/2020   BILITOT 0.8 10/24/2020   ALKPHOS 66 07/20/2019   AST 16 10/24/2020   ALT 14 10/24/2020   PROT 6.6 10/24/2020   ALBUMIN 4.2 07/20/2019   CALCIUM 9.7 10/24/2020   GFR 74.78 07/20/2019   Lab Results  Component Value Date   CHOL 280 (H) 10/24/2020   Lab Results  Component Value Date   HDL 54 10/24/2020   Lab Results  Component Value Date   LDLCALC 180 (H) 10/24/2020   Lab Results  Component Value Date   TRIG 246 (H) 10/24/2020   Lab Results  Component Value Date   CHOLHDL 5.2 (H) 10/24/2020   No results found for: HGBA1C    Assessment & Plan:   Patient relates several week history of pain mostly centered around her right knee medially and posteriorly.  No specific injury.  She does have some medial joint space tenderness.  Question meniscus injury.  Low clinical suspicion for DVT.  Her left lower extremity is actually larger than her right.  -We will start with some plain films of the right knee. -Discussed trial of over-the-counter diclofenac gel and icing. -If x-ray is unremarkable and not improving over the next several days consider referral to sports medicine for further evaluation  No orders of the defined types were placed in this encounter.   Follow-up: No follow-ups on file.    Carolann Littler, MD

## 2021-01-18 ENCOUNTER — Telehealth: Payer: Self-pay

## 2021-01-18 NOTE — Telephone Encounter (Signed)
-----   Message from Eulas Post, MD sent at 01/17/2021  3:00 PM EST ----- Does have some degenerative arthritis changes of knee- but no acute bony abnormality.  If knee pain not further improved in couple of weeks let us know and can set up sports medicine referral, as discussed.

## 2021-01-18 NOTE — Telephone Encounter (Signed)
Left message for patient to call back regarding results and recommendations. °

## 2021-01-25 ENCOUNTER — Other Ambulatory Visit: Payer: Self-pay | Admitting: Family Medicine

## 2021-01-25 DIAGNOSIS — E785 Hyperlipidemia, unspecified: Secondary | ICD-10-CM

## 2021-01-25 NOTE — Progress Notes (Signed)
Per last lab results PCP wanted BMP & Lipid ordered/placed order/thx dmf

## 2021-01-29 ENCOUNTER — Other Ambulatory Visit: Payer: PPO

## 2021-02-01 ENCOUNTER — Other Ambulatory Visit (INDEPENDENT_AMBULATORY_CARE_PROVIDER_SITE_OTHER): Payer: PPO

## 2021-02-01 ENCOUNTER — Other Ambulatory Visit: Payer: Self-pay

## 2021-02-01 DIAGNOSIS — E785 Hyperlipidemia, unspecified: Secondary | ICD-10-CM | POA: Diagnosis not present

## 2021-02-01 LAB — LIPID PANEL
Cholesterol: 225 mg/dL — ABNORMAL HIGH (ref 0–200)
HDL: 49.8 mg/dL (ref >39.00)
NonHDL: 175.12
Total CHOL/HDL Ratio: 5
Triglycerides: 203 mg/dL — ABNORMAL HIGH (ref 0.0–149.0)
VLDL: 40.6 mg/dL — ABNORMAL HIGH (ref 0.0–40.0)

## 2021-02-01 LAB — LDL CHOLESTEROL, DIRECT: Direct LDL: 147 mg/dL

## 2021-02-04 ENCOUNTER — Telehealth: Payer: Self-pay

## 2021-02-04 NOTE — Telephone Encounter (Signed)
-----   Message from Eulas Post, MD sent at 02/02/2021  7:14 PM EST ----- Lipids are much improved on the Lipitor 20 mg daily, but still fairly high.  If she tolerated the 20 mg Lipitor with no difficulty I would further titrate the Lipitor to 40 mg daily and repeat lipids in about 6 months.

## 2021-02-04 NOTE — Telephone Encounter (Signed)
Left message for patient to call back to discuss results and recommendations.

## 2021-02-06 ENCOUNTER — Telehealth: Payer: Self-pay

## 2021-02-06 MED ORDER — ATORVASTATIN CALCIUM 20 MG PO TABS: 20.0000 mg | ORAL_TABLET | Freq: Every day | ORAL | 1 refills | Status: DC

## 2021-02-06 NOTE — Telephone Encounter (Signed)
Spoke with patient regarding results.  She admits to not taking her lipitor regularly and she has recently lost her husband and would like to hold off on medication changes.  Refill sent to pharmacy and lab appointment scheduled.

## 2021-02-06 NOTE — Telephone Encounter (Signed)
Left message for patient to call back to disucss lab results and recommendations.

## 2021-02-06 NOTE — Telephone Encounter (Signed)
-----   Message from Eulas Post, MD sent at 02/02/2021  7:14 PM EST ----- Lipids are much improved on the Lipitor 20 mg daily, but still fairly high.  If she tolerated the 20 mg Lipitor with no difficulty I would further titrate the Lipitor to 40 mg daily and repeat lipids in about 6 months.

## 2021-02-18 ENCOUNTER — Ambulatory Visit (INDEPENDENT_AMBULATORY_CARE_PROVIDER_SITE_OTHER): Payer: PPO | Admitting: Family Medicine

## 2021-02-18 ENCOUNTER — Other Ambulatory Visit: Payer: Self-pay

## 2021-02-18 ENCOUNTER — Encounter: Payer: Self-pay | Admitting: Family Medicine

## 2021-02-18 VITALS — BP 128/80 | HR 84 | Ht 67.0 in | Wt 251.0 lb

## 2021-02-18 DIAGNOSIS — F418 Other specified anxiety disorders: Secondary | ICD-10-CM | POA: Diagnosis not present

## 2021-02-18 DIAGNOSIS — F4321 Adjustment disorder with depressed mood: Secondary | ICD-10-CM

## 2021-02-18 NOTE — Progress Notes (Signed)
Established Patient Office Visit  Subjective:  Patient ID: Heather Oneal, female    DOB: 1941/01/25  Age: 80 y.o. MRN: [id number]  CC:  Chief Complaint  Patient presents with  . Depression  . Anxiety    HPI Heather Oneal presents for the following issues.  She states her husband passed away on the Apr 04, 2023 of this month.  He apparently had a fall at home about 3 weeks prior.  He was seen by EMS initially in the home but not taken in.  About 5 days later after the fall he had inability to get out words.  Was taken into the ER and had CT which showed apparently large subdural.  He had a couple of back-to-back ER visits but was not admitted.  He apparently had some worsening of status and was hospitalized for 8 days and then eventually transferred to nursing home.  He ended up having complications in the nursing home and died at the hospital possibly of aspiration pneumonia.  Wife is not sure.  He had declining health for some time with Parkinson's disease.  Overall, she is coping fairly well with his death but her main stress has been that she has a son who has mental illness who came back from [place] to live with her during the past few weeks of his death.  He is still staying with her this time.  She states that he is lived "on the streets "in the past.  She states he has had some delusions and frequently hears voices.  She had them committed once before years ago but he was kept only one night and then let go.  He is currently apparently not on any medications.  She has had some concerns about his erratic behavior.  She is not aware of him using any illicit drugs.  She is not sure if he has any formal diagnoses from previously from when he was in [place]  Past Medical History:  Diagnosis Date  . Allergy    seasonal  . Anemia    as a teenager  . Anxiety   . Depression   . DEPRESSION 01/28/2010  . History of IBS   . Hyperlipidemia   . HYPERLIPIDEMIA 01/28/2010    Past Surgical  History:  Procedure Laterality Date  . APPENDECTOMY  1973  . CHOLECYSTECTOMY  2008  . COLONOSCOPY    . OVARIAN CYST SURGERY  1973   ruptured ovarian cyst  . POLYPECTOMY    . TONSILLECTOMY  1948    Family History  Problem Relation Age of Onset  . Heart disease Mother   . Hypertension Mother   . Cancer Sister 29       breast  . Crohn's disease Brother   . Diabetes Maternal Grandmother   . Heart disease Paternal Grandfather   . Stomach cancer Paternal Grandmother   . Colon cancer Neg Hx   . Pancreatic cancer Neg Hx   . Rectal cancer Neg Hx   . Esophageal cancer Neg Hx   . Colon polyps Neg Hx     Social History   Socioeconomic History  . Marital status: Married    Spouse name: Not on file  . Number of children: Not on file  . Years of education: Not on file  . Highest education level: Not on file  Occupational History  . Not on file  Tobacco Use  . Smoking status: Never Smoker  . Smokeless tobacco: Never Used  Vaping Use  .  Vaping Use: Never used  Substance and Sexual Activity  . Alcohol use: Yes    Comment: rarely  . Drug use: No  . Sexual activity: Not on file  Other Topics Concern  . Not on file  Social History Narrative  . Not on file   Social Determinants of Health   Financial Resource Strain: Low Risk   . Difficulty of Paying Living Expenses: Not hard at all  Food Insecurity: Not on file  Transportation Needs: Not on file  Physical Activity: Not on file  Stress: Not on file  Social Connections: Not on file  Intimate Partner Violence: Not on file    Outpatient Medications Prior to Visit  Medication Sig Dispense Refill  . atorvastatin (LIPITOR) 20 MG tablet Take 1 tablet (20 mg total) by mouth daily. 90 tablet 1  . guaiFENesin (MUCINEX PO) Take by mouth.    . Multiple Vitamin (MULTIVITAMIN) tablet Take 1 tablet by mouth daily.    Marland Kitchen olopatadine (PATANOL) 0.1 % ophthalmic solution Place 1 drop into both eyes 2 (two) times daily as needed for  allergies. 5 mL 12  . fluticasone (FLONASE) 50 MCG/ACT nasal spray Place 1 spray into both nostrils daily. 16 g 0   No facility-administered medications prior to visit.    Allergies  Allergen Reactions  . Crestor [Rosuvastatin]     " Ears itching and throat burning"    ROS Review of Systems  Constitutional: Negative for chills and fever.  Respiratory: Negative for shortness of breath.   Cardiovascular: Negative for chest pain.  Neurological: Negative for headaches.  Psychiatric/Behavioral: Negative for agitation, hallucinations and suicidal ideas. The patient is nervous/anxious.       Objective:    Physical Exam Vitals reviewed.  Constitutional:      Appearance: Normal appearance.  Cardiovascular:     Rate and Rhythm: Normal rate and regular rhythm.  Pulmonary:     Effort: Pulmonary effort is normal.     Breath sounds: Normal breath sounds.  Neurological:     Mental Status: She is alert.  Psychiatric:     Comments: PHQ-9 score of 8     BP 128/80   Pulse 84   Ht 5\' 7"  (1.702 m)   Wt 251 lb (113.9 kg)   SpO2 94%   BMI 39.31 kg/m  Wt Readings from Last 3 Encounters:  02/18/21 251 lb (113.9 kg)  01/16/21 254 lb (115.2 kg)  10/24/20 249 lb (112.9 kg)     Health Maintenance Due  Topic Date Due  . DEXA SCAN  Never done  . COVID-19 Vaccine (3 - Booster for Pfizer series) 09/20/2020    There are no preventive care reminders to display for this patient.  Lab Results  Component Value Date   TSH 3.34 10/24/2020   Lab Results  Component Value Date   WBC 7.2 10/24/2020   HGB 13.3 10/24/2020   HCT 39.8 10/24/2020   MCV 89.4 10/24/2020   PLT 330 10/24/2020   Lab Results  Component Value Date   NA 139 10/24/2020   K 4.9 10/24/2020   CO2 30 10/24/2020   GLUCOSE 98 10/24/2020   BUN 15 10/24/2020   CREATININE 0.75 10/24/2020   BILITOT 0.8 10/24/2020   ALKPHOS 66 07/20/2019   AST 16 10/24/2020   ALT 14 10/24/2020   PROT 6.6 10/24/2020   ALBUMIN 4.2  07/20/2019   CALCIUM 9.7 10/24/2020   GFR 74.78 07/20/2019   Lab Results  Component Value Date  CHOL 225 (H) 02/01/2021   Lab Results  Component Value Date   HDL 49.80 02/01/2021   Lab Results  Component Value Date   LDLCALC 180 (H) 10/24/2020   Lab Results  Component Value Date   TRIG 203.0 (H) 02/01/2021   Lab Results  Component Value Date   CHOLHDL 5 02/01/2021   No results found for: HGBA1C    Assessment & Plan:   #1 situational anxiety.  Patient is difficult situation as above her son who has undiagnosed mental illness living with her.  He apparently is having some delusions and hallucinations. -We stressed first of all the importance of her safety. -By her description, he has had some erratic behaviors at times though he has not physically threatened her-but she states he has been verbally aggressive at times.. -We explained if he is unwilling to get help himself would probably need to be committed given the fact that he does pose risk to himself and or others based on described facts. -Her son does not have any insurance currently and she will check with local [place] first. -She knows to call 911 if necessary to get him committed if he is unwilling to go for help himself  #2 grieving related to passing of her husband. -We discussed normal anticipatory grief.  She did have questions about antidepressants.  We recommend observing for now.  We have offered counseling but she declines this time.  No orders of the defined types were placed in this encounter.   Follow-up: No follow-ups on file.    Carolann Littler, MD

## 2021-02-18 NOTE — Patient Instructions (Signed)
[facility]  [phone].

## 2021-04-18 ENCOUNTER — Telehealth: Payer: Self-pay

## 2021-04-18 NOTE — Chronic Care Management (AMB) (Signed)
Chronic Care Management Pharmacy Assistant   Name: Heather Oneal  MRN: [id number] DOB: 15-Jan-1941   Reason for Encounter: Disease State/ Hyperlipidemia Assessment Call.   Conditions to be addressed/monitored: HLD   Recent office visits:   10/24/20 Eulas Post MD (PCP) - seen for physical exam. No medication changes. No follow ups on file.   01/16/21 Eulas Post MD (PCP) - seen for right knee pain. Discontinued cetirizine HCL 10mg , coenzyme Q10 30mg , cyanobalamin tablet, dicyclomine HCL 10mg , fexofenadine HCL 180mg , montelukast 10mg  and probiotic product. No follow ups on file.   02/18/21 Eulas Post MD (PCP) - seen for situational anxiety and grieving. Discontinued fluticasone propionate 66mcg/act. No follow ups on file.    Recent consult visits:   None.    Hospital visits:  None in previous 6 months  Medications: Outpatient Encounter Medications as of 04/18/2021  Medication Sig Note  . atorvastatin (LIPITOR) 20 MG tablet Take 1 tablet (20 mg total) by mouth daily.   Marland Kitchen guaiFENesin (MUCINEX PO) Take by mouth. 07/11/2019: Take as needed  . Multiple Vitamin (MULTIVITAMIN) tablet Take 1 tablet by mouth daily.   Marland Kitchen olopatadine (PATANOL) 0.1 % ophthalmic solution Place 1 drop into both eyes 2 (two) times daily as needed for allergies.    No facility-administered encounter medications on file as of 04/18/2021.   Comprehensive medication review performed; Spoke to patient regarding cholesterol.   Lipid Panel    Component Value Date/Time   CHOL 225 (H) 02/01/2021 0934   TRIG 203.0 (H) 02/01/2021 0934   HDL 49.80 02/01/2021 0934   LDLCALC 180 (H) 10/24/2020 1112   LDLDIRECT 147.0 02/01/2021 0934    10-year ASCVD risk score: The 10-year ASCVD risk score Mikey Bussing DC Brooke Bonito., et al., 2013) is: 23.6%   Values used to calculate the score:     Age: 80 years     Sex: Female     Is Non-Hispanic African American: No     Diabetic: No     Tobacco smoker: No      Systolic Blood Pressure: 062 mmHg     Is BP treated: No     HDL Cholesterol: 49.8 mg/dL     Total Cholesterol: 225 mg/dL  . Current antihyperlipidemic regimen:  o Atorvastatin 20mg   - take one tablet daily.  . Previous antihyperlipidemic medications tried: Crestor.   . ASCVD risk enhancing conditions: current smoker.  . What recent interventions/DTPs have been made by any provider to improve Cholesterol control since last CPP Visit: none.   . Any recent hospitalizations or ED visits since last visit with CPP? No  . What diet changes have been made to improve Cholesterol?  o  Patient states that she has been eating pretty irregularly and sometimes TV dinners. She did state that she has been eating more salads lately and she cooks sometimes.    . What exercise is being done to improve Cholesterol?  o Patient states that she is in the process of moving. Her husband recently passed away and she is very busy with arrangements and things for her husbands business that are needing to be dealt with currently. Patients son has been living with her and she states she just has a lot of stress on her right now. She was not taking her atorvastatin regularly but has been for about the past week. Patient states that she does not get exercise regularly but she is active due to the many things going on in her life  right such as packing and being in the process of moving.   Adherence Review: Does the patient have >5 day gap between last estimated fill dates? No   Star Rating Drugs:  Atorvastatin 20mg  - last filled on 02/06/21 90DS at [facility].  Patient due for follow up CCM visit with clinical pharmacist: Patient scheduled for 05/22/21 at 4pm for follow up visit in person with Jeni Salles clinical pharmacist.   [place] [phone]

## 2021-04-24 ENCOUNTER — Telehealth: Payer: Self-pay | Admitting: Family Medicine

## 2021-04-24 ENCOUNTER — Ambulatory Visit: Payer: PPO

## 2021-04-24 NOTE — Telephone Encounter (Signed)
Left message asking patient to call office. Please r//s 5/4 awv appointment laura out sick

## 2021-05-08 ENCOUNTER — Ambulatory Visit: Payer: PPO

## 2021-05-16 ENCOUNTER — Ambulatory Visit: Payer: PPO

## 2021-05-21 ENCOUNTER — Telehealth: Payer: Self-pay | Admitting: Pharmacist

## 2021-05-21 NOTE — Chronic Care Management (AMB) (Signed)
    Chronic Care Management Pharmacy Assistant   Name: Heather Oneal  MRN: [id number] DOB: 1941-06-15  05/21/21- called patient to remind of appointment with Heather Oneal) on (05/22/21 at 4pm in person)   No answer, left message of appointment date, time and type of appointment (either telephone or in person). Left message to have all medications, supplements, blood pressure and/or blood sugar logs available during appointment and to return call if need to reschedule.   [place]  Clinical Pharmacist Assistant [phone]

## 2021-05-22 ENCOUNTER — Ambulatory Visit (INDEPENDENT_AMBULATORY_CARE_PROVIDER_SITE_OTHER): Payer: PPO | Admitting: Pharmacist

## 2021-05-22 ENCOUNTER — Other Ambulatory Visit: Payer: Self-pay

## 2021-05-22 DIAGNOSIS — E785 Hyperlipidemia, unspecified: Secondary | ICD-10-CM | POA: Diagnosis not present

## 2021-05-22 DIAGNOSIS — J309 Allergic rhinitis, unspecified: Secondary | ICD-10-CM

## 2021-05-22 DIAGNOSIS — H9190 Unspecified hearing loss, unspecified ear: Secondary | ICD-10-CM

## 2021-05-22 NOTE — Progress Notes (Signed)
Chronic Care Management Pharmacy Note  05/23/2021 Name:  Heather Oneal MRN:  [id number] DOB:  [birth year]  Summary: LDL not at goal < 100 due to non-adherence and lifestyle.  Recommendations/Changes made from today's visit: -Switching atorvastatin to morning to improve adherence and setting an alarm to take her medication at the same time each morning -Provided behavioral health phone number and encouraged setting up an appointment  Plan: -Follow up on adherence in 2-3 weeks.  Subjective: Heather Oneal is an 80 y.o. year old female who is a primary patient of Burchette, Alinda Sierras, MD.  The CCM team was consulted for assistance with disease management and care coordination needs.    Engaged with patient face to face for follow up visit in response to provider referral for pharmacy case management and/or care coordination services.   Consent to Services:  The patient was given information about Chronic Care Management services, agreed to services, and gave verbal consent prior to initiation of services.  Please see initial visit note for detailed documentation.   Patient Care Team: Eulas Post, MD as PCP - General Viona Gilmore, [facility] as Pharmacist (Pharmacist)  Recent office visits: 02/18/21 Eulas Post MD (PCP) - seen for situational anxiety and grieving. Discontinued fluticasone propionate 63mg/act. No follow ups on file.   01/16/21 BEulas PostMD (PCP) - seen for right knee pain. Discontinued cetirizine HCL 1103m coenzyme Q10 3064mcyanobalamin tablet, dicyclomine HCL 41m60mexofenadine HCL 180mg52mntelukast 41mg 32mprobiotic product. No follow ups on file.   Recent consult visits: None  Hospital visits: None in previous 6 months   Objective:  Lab Results  Component Value Date   CREATININE 0.75 10/24/2020   BUN 15 10/24/2020   GFR 74.78 07/20/2019   NA 139 10/24/2020   K 4.9 10/24/2020   CALCIUM 9.7 10/24/2020   CO2 30 10/24/2020    GLUCOSE 98 10/24/2020    Lab Results  Component Value Date/Time   GFR 74.78 07/20/2019 12:19 PM   GFR 89.33 02/08/2018 11:37 AM    Last diabetic Eye exam: No results found for: HMDIABEYEEXA  Last diabetic Foot exam: No results found for: HMDIABFOOTEX   Lab Results  Component Value Date   CHOL 225 (H) 02/01/2021   HDL 49.80 02/01/2021   LDLCALC 180 (H) 10/24/2020   LDLDIRECT 147.0 02/01/2021   TRIG 203.0 (H) 02/01/2021   CHOLHDL 5 02/01/2021    Hepatic Function Latest Ref Rng & Units 10/24/2020 07/20/2019 02/08/2018  Total Protein 6.1 - 8.1 g/dL 6.6 6.9 6.8  Albumin 3.5 - 5.2 g/dL - 4.2 4.0  AST 10 - 35 U/L _0 ALT 6 - 29 U/L _1 Alk Phosphatase 39 - 117 U/L - 66 61  Total Bilirubin 0.2 - 1.2 mg/dL 0.8 0.9 0.6  Bilirubin, Direct 0.0 - 0.2 mg/dL 0.1 0.1 0.1    Lab Results  Component Value Date/Time   TSH 3.34 10/24/2020 11:12 AM   TSH 2.57 07/20/2019 12:19 PM    CBC Latest Ref Rng & Units 10/24/2020 07/20/2019 02/08/2018  WBC 3.8 - 10.8 Thousand/uL 7.2 6.9 7.1  Hemoglobin 11.7 - 15.5 g/dL 13.3 13.0 12.7  Hematocrit 35.0 - 45.0 % 39.8 40.1 38.1  Platelets 140 - 400 Thousand/uL 330 339.0 362.0    No results found for: VD25OH  Clinical ASCVD: No  The 10-year ASCVD risk score (Goff Mikey Bussing., et al., 2013) is: 23.6%   Values used to calculate the score:  Age: 71 years     Sex: Female     Is Non-Hispanic African American: No     Diabetic: No     Tobacco smoker: No     Systolic Blood Pressure: 314 mmHg     Is BP treated: No     HDL Cholesterol: 49.8 mg/dL     Total Cholesterol: 225 mg/dL    Depression screen [facility] 2/9 10/24/2020 06/08/2015 11/23/2013  Decreased Interest 0 0 0  Down, Depressed, Hopeless 1 0 0  PHQ - 2 Score 1 0 0      Social History   Tobacco Use  Smoking Status Never Smoker  Smokeless Tobacco Never Used   BP Readings from Last 3 Encounters:  02/18/21 128/80  01/16/21 138/88  10/24/20 112/68   Pulse Readings from Last 3 Encounters:   02/18/21 84  01/16/21 75  10/24/20 79   Wt Readings from Last 3 Encounters:  02/18/21 251 lb (113.9 kg)  01/16/21 254 lb (115.2 kg)  10/24/20 249 lb (112.9 kg)   BMI Readings from Last 3 Encounters:  02/18/21 39.31 kg/m  01/16/21 39.78 kg/m  10/24/20 39.00 kg/m    Assessment/Interventions: Review of patient past medical history, allergies, medications, health status, including review of consultants reports, laboratory and other test data, was performed as part of comprehensive evaluation and provision of chronic care management services.   SDOH:  (Social Determinants of Health) assessments and interventions performed: No  SDOH Screenings   Alcohol Screen: Not on file  Depression (PHQ2-9): Low Risk   . PHQ-2 Score: 1  Financial Resource Strain: Not on file  Food Insecurity: Not on file  Housing: Not on file  Physical Activity: Not on file  Social Connections: Not on file  Stress: Not on file  Tobacco Use: Low Risk   . Smoking Tobacco Use: Never Smoker  . Smokeless Tobacco Use: Never Used  Transportation Needs: Not on file    CCM Care Plan  Allergies  Allergen Reactions  . Crestor [Rosuvastatin]     " Ears itching and throat burning"    Medications Reviewed Today    Reviewed by Eulas Post, MD (Physician) on 02/18/21 at 1526  Med List Status: <None>  Medication Order Taking? Sig Documenting Provider Last Dose Status Informant  atorvastatin (LIPITOR) 20 MG tablet [id number] Yes Take 1 tablet (20 mg total) by mouth daily. Eulas Post, MD Taking Active   guaiFENesin (MUCINEX PO) [id number] Yes Take by mouth. [provider] Taking Active            Med Note Marlou Starks   Mon Jul 11, 2019 11:28 AM) Take as needed  Multiple Vitamin (MULTIVITAMIN) tablet [id number] Yes Take 1 tablet by mouth daily. [provider] Taking Active   olopatadine (PATANOL) 0.1 % ophthalmic solution [id number] Yes Place 1 drop into both eyes 2 (two) times  daily as needed for allergies. Eulas Post, MD Taking Active           Patient Active Problem List   Diagnosis Date Noted  . Venous stasis 07/20/2019  . Obesity (BMI 30-39.9) 11/23/2013  . Allergic rhinitis 11/23/2013  . Leg pain, left 04/10/2011  . CHEST PAIN-PRECORDIAL 02/10/2011  . HYPERLIPIDEMIA 01/28/2010  . DEPRESSION 01/28/2010  . SCIATICA, LEFT 01/28/2010  . IBS (irritable bowel syndrome) 01/28/2010    Immunization History  Administered Date(s) Administered  . Influenza,inj,Quad PF,6+ Mos 11/23/2013  . PFIZER(Purple Top)SARS-COV-2 Vaccination 02/19/2020, 03/20/2020  . Pneumococcal Conjugate-13 02/08/2018  .  Pneumococcal Polysaccharide-23 12/22/2008  . Tdap 11/23/2013   Lab Results  Component Value Date   CHOL 225 (H) 02/01/2021   HDL 49.80 02/01/2021   LDLCALC 180 (H) 10/24/2020   LDLDIRECT 147.0 02/01/2021   TRIG 203.0 (H) 02/01/2021   CHOLHDL 5 02/01/2021   Patient requested a referral to audiology and sent message to PCP.  Conditions to be addressed/monitored:  Hyperlipidemia and Allergic Rhinitis  Care Plan : CCM Pharmacy Care Plan  Updates made by Viona Gilmore, RPH since 05/23/2021 12:00 AM    Problem: Problem: Hyperlipidemia and allergic rhinitis     Long-Range Goal: Patient-Specific Goal   Start Date: 05/22/2021  Expected End Date: 04/21/2022  This Visit's Progress: On track  Priority: High  Note:   Current Barriers:  . Unable to achieve control of cholesterol  . Unable to self administer medications as prescribed  Pharmacist Clinical Goal(s):  Marland Kitchen Patient will achieve adherence to monitoring guidelines and medication adherence to achieve therapeutic efficacy . achieve control of cholesterol as evidenced by next lipid panel  through collaboration with PharmD and provider.   Interventions: . 1:1 collaboration with Eulas Post, MD regarding development and update of comprehensive plan of care as evidenced by provider attestation and  co-signature . Inter-disciplinary care team collaboration (see longitudinal plan of care) . Comprehensive medication review performed; medication list updated in electronic medical record  Hyperlipidemia: (LDL goal < 100) -Uncontrolled -Current treatment: . Atorvastatin 20 mg 1 tablet daily -Medications previously tried: Crestor (ears itching/ burning) -Current dietary patterns: eating TV dinners; granola for breakfast for fiber -Current exercise habits: does not exercise -Educated on Cholesterol goals;  Benefits of statin for ASCVD risk reduction; Importance of limiting foods high in cholesterol; Exercise goal of 150 minutes per week; -Counseled on diet and exercise extensively Recommended to continue current medication Recommended taking in the morning to improve adherence and setting an alarm to remember to take daily  Allergic rhinitis (Goal: minimize symptoms of allergies) -Controlled -Current treatment   guaifenesin (Mucinex)  cetirizine (Zyrtec) 68m, 1 tablet once daily  fluticasone (Flonase) 53m/ act nasal spray, 1 spray into both nostrils daily   olopatadine (Patanol) 0.1% solution, place 1 drop into both eye twice daily as needed for allergies  -Medications previously tried: n/a  -Recommended to continue current medication Counseled on avoidance of triggers  Health Maintenance -Vaccine gaps: shingles, COVID booster -Current therapy:  . Multivitamin 1 tablet daily -Educated on Cost vs benefit of each product must be carefully weighed by individual consumer -Patient is satisfied with current therapy and denies issues -Recommended to continue current medication  Patient Goals/Self-Care Activities . Patient will:  - focus on medication adherence by switching atorvastatin to morning and setting an alarm target a minimum of 150 minutes of moderate intensity exercise weekly engage in dietary modifications by adding more fiber to her diet  Follow Up Plan: The care  management team will reach out to the patient again over the next 30 days.          Medication Assistance: None required.  Patient affirms current coverage meets needs.  Compliance/Adherence/Medication fill history: Care Gaps: COVID booster, shingrix  Star-Rating Drugs: Atorvastatin 20 mg - last dispensed 02/06/21 for 90 ds  [place]:  [place] [id number]one: [phone]: [address] [address] [place]  [address] Phone: [phone] Fax: [place], [address] AT [place] [address] Phone: [phone] Fax: [phone]  Uses pill box? No - 1 medication Pt endorses 50% compliance  We discussed: Current pharmacy is preferred with insurance plan and patient is satisfied with pharmacy services Patient decided to: Continue current medication management strategy  Care Plan and Follow Up Patient Decision:  Patient agrees to Care Plan and Follow-up.  Plan: The care management team will reach out to the patient again over the next 30 days.  Jeni Salles, PharmD [facility] Clinical Pharmacist [place] [facility] [place]

## 2021-05-22 NOTE — Patient Instructions (Addendum)
Try taking atorvastatin first thing in the morning to keep a routine to keep it consistent   Jeni Salles, PharmD [place] [facility] [place]   High Cholesterol  High cholesterol is a condition in which the blood has high levels of a white, waxy substance similar to fat (cholesterol). The liver makes all the cholesterol that the body needs. The human body needs small amounts of cholesterol to help build cells. A person gets extra or excess cholesterol from the food that he or she eats. The blood carries cholesterol from the liver to the rest of the body. If you have high cholesterol, deposits (plaques) may build up on the walls of your arteries. Arteries are the blood vessels that carry blood away from your heart. These plaques make the arteries narrow and stiff. Cholesterol plaques increase your risk for heart attack and stroke. Work with your health care provider to keep your cholesterol levels in a healthy range. What increases the risk? The following factors may make you more likely to develop this condition:  Eating foods that are high in animal fat (saturated fat) or cholesterol.  Being overweight.  Not getting enough exercise.  A family history of high cholesterol (familial hypercholesterolemia).  Use of tobacco products.  Having diabetes. What are the signs or symptoms? There are no symptoms of this condition. How is this diagnosed? This condition may be diagnosed based on the results of a blood test.  If you are older than 80 years of age, your health care provider may check your cholesterol levels every 4-6 years.  You may be checked more often if you have high cholesterol or other risk factors for heart disease. The blood test for cholesterol measures:  "Bad" cholesterol, or LDL cholesterol. This is the main type of cholesterol that causes heart disease. The desired level is less than 100 mg/dL.  "Good" cholesterol,  or HDL cholesterol. HDL helps protect against heart disease by cleaning the arteries and carrying the LDL to the liver for processing. The desired level for HDL is 60 mg/dL or higher.  Triglycerides. These are fats that your body can store or burn for energy. The desired level is less than 150 mg/dL.  Total cholesterol. This measures the total amount of cholesterol in your blood and includes LDL, HDL, and triglycerides. The desired level is less than 200 mg/dL. How is this treated? This condition may be treated with:  Diet changes. You may be asked to eat foods that have more fiber and less saturated fats or added sugar.  Lifestyle changes. These may include regular exercise, maintaining a healthy weight, and quitting use of tobacco products.  Medicines. These are given when diet and lifestyle changes have not worked. You may be prescribed a statin medicine to help lower your cholesterol levels. Follow these instructions at home: Eating and drinking  Eat a healthy, balanced diet. This diet includes: ? Daily servings of a variety of fresh, frozen, or canned fruits and vegetables. ? Daily servings of whole grain foods that are rich in fiber. ? Foods that are low in saturated fats and trans fats. These include poultry and fish without skin, lean cuts of meat, and low-fat dairy products. ? A variety of fish, especially oily fish that contain omega-3 fatty acids. Aim to eat fish at least 2 times a week.  Avoid foods and drinks that have added sugar.  Use healthy cooking methods, such as roasting, grilling, broiling, baking, poaching, steaming, and stir-frying. Do  not fry your food except for stir-frying.   Lifestyle  Get regular exercise. Aim to exercise for a total of 150 minutes a week. Increase your activity level by doing activities such as gardening, walking, and taking the stairs.  Do not use any products that contain nicotine or tobacco, such as cigarettes, e-cigarettes, and chewing  tobacco. If you need help quitting, ask your health care provider.   General instructions  Take over-the-counter and prescription medicines only as told by your health care provider.  Keep all follow-up visits as told by your health care provider. This is important. Where to find more information  American Heart Association: www.heart.org  National Heart, Lung, and Blood Institute: https://wilson-eaton.com/ Contact a health care provider if:  You have trouble achieving or maintaining a healthy diet or weight.  You are starting an exercise program.  You are unable to stop smoking. Get help right away if:  You have chest pain.  You have trouble breathing.  You have any symptoms of a stroke. "BE FAST" is an easy way to remember the main warning signs of a stroke: ? B - Balance. Signs are dizziness, sudden trouble walking, or loss of balance. ? E - Eyes. Signs are trouble seeing or a sudden change in vision. ? F - Face. Signs are sudden weakness or numbness of the face, or the face or eyelid drooping on one side. ? A - Arms. Signs are weakness or numbness in an arm. This happens suddenly and usually on one side of the body. ? S - Speech. Signs are sudden trouble speaking, slurred speech, or trouble understanding what people say. ? T - Time. Time to call emergency services. Write down what time symptoms started.  You have other signs of a stroke, such as: ? A sudden, severe headache with no known cause. ? Nausea or vomiting. ? Seizure. These symptoms may represent a serious problem that is an emergency. Do not wait to see if the symptoms will go away. Get medical help right away. Call your local emergency services (911 in the [place] Do not drive yourself to the hospital. Summary  Cholesterol plaques increase your risk for heart attack and stroke. Work with your health care provider to keep your cholesterol levels in a healthy range.  Eat a healthy, balanced diet, get regular exercise, and  maintain a healthy weight.  Do not use any products that contain nicotine or tobacco, such as cigarettes, e-cigarettes, and chewing tobacco.  Get help right away if you have any symptoms of a stroke. This information is not intended to replace advice given to you by your health care provider. Make sure you discuss any questions you have with your health care provider. Document Revised: 11/07/2019 Document Reviewed: 11/07/2019 Elsevier Patient Education  2021 [employer].

## 2021-05-26 NOTE — Addendum Note (Signed)
Addended by: Eulas Post on: 05/26/2021 03:49 PM   Modules accepted: Orders

## 2021-06-11 ENCOUNTER — Ambulatory Visit: Payer: PPO

## 2021-06-12 ENCOUNTER — Ambulatory Visit (INDEPENDENT_AMBULATORY_CARE_PROVIDER_SITE_OTHER): Payer: PPO

## 2021-06-12 ENCOUNTER — Other Ambulatory Visit: Payer: Self-pay

## 2021-06-12 VITALS — BP 138/70 | HR 63 | Temp 98.0°F | Ht 67.0 in | Wt 249.0 lb

## 2021-06-12 DIAGNOSIS — Z0000 Encounter for general adult medical examination without abnormal findings: Secondary | ICD-10-CM

## 2021-06-12 DIAGNOSIS — F419 Anxiety disorder, unspecified: Secondary | ICD-10-CM | POA: Diagnosis not present
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
## 2021-06-12 NOTE — Patient Instructions (Signed)
Heather Oneal , Thank you for taking time to come for your Medicare Wellness Visit. I appreciate your ongoing commitment to your health goals. Please review the following plan we discussed and let me know if I can assist you in the future.   Screening recommendations/referrals: Colonoscopy: due 10/13/2024 Mammogram: no longer required  Bone Density: 10/24/2020 due 2023 Recommended yearly ophthalmology/optometry visit for glaucoma screening and checkup Recommended yearly dental visit for hygiene and checkup  Vaccinations: Influenza vaccine: due in fall 2022 Pneumococcal vaccine: completed series  Tdap vaccine: current 11/23/2013 due 2024 Shingles vaccine: will obtain local pharmacy   Advanced directives: none   Conditions/risks identified: none   Next appointment: none    Preventive Care 80 Years and Older, Female Preventive care refers to lifestyle choices and visits with your health care provider that can promote health and wellness. What does preventive care include? A yearly physical exam. This is also called an annual well check. Dental exams once or twice a year. Routine eye exams. Ask your health care provider how often you should have your eyes checked. Personal lifestyle choices, including: Daily care of your teeth and gums. Regular physical activity. Eating a healthy diet. Avoiding tobacco and drug use. Limiting alcohol use. Practicing safe sex. Taking low-dose aspirin every day. Taking vitamin and mineral supplements as recommended by your health care provider. What happens during an annual well check? The services and screenings done by your health care provider during your annual well check will depend on your age, overall health, lifestyle risk factors, and family history of disease. Counseling  Your health care provider may ask you questions about your: Alcohol use. Tobacco use. Drug use. Emotional well-being. Home and relationship well-being. Sexual  activity. Eating habits. History of falls. Memory and ability to understand (cognition). Work and work Statistician. Reproductive health. Screening  You may have the following tests or measurements: Height, weight, and BMI. Blood pressure. Lipid and cholesterol levels. These may be checked every 5 years, or more frequently if you are over 17 years old. Skin check. Lung cancer screening. You may have this screening every year starting at age 80 if you have a 30-pack-year history of smoking and currently smoke or have quit within the past 15 years. Fecal occult blood test (FOBT) of the stool. You may have this test every year starting at age 35. Flexible sigmoidoscopy or colonoscopy. You may have a sigmoidoscopy every 5 years or a colonoscopy every 10 years starting at age 61. Hepatitis C blood test. Hepatitis B blood test. Sexually transmitted disease (STD) testing. Diabetes screening. This is done by checking your blood sugar (glucose) after you have not eaten for a while (fasting). You may have this done every 1-3 years. Bone density scan. This is done to screen for osteoporosis. You may have this done starting at age 60. Mammogram. This may be done every 1-2 years. Talk to your health care provider about how often you should have regular mammograms. Talk with your health care provider about your test results, treatment options, and if necessary, the need for more tests. Vaccines  Your health care provider may recommend certain vaccines, such as: Influenza vaccine. This is recommended every year. Tetanus, diphtheria, and acellular pertussis (Tdap, Td) vaccine. You may need a Td booster every 10 years. Zoster vaccine. You may need this after age 90. Pneumococcal 13-valent conjugate (PCV13) vaccine. One dose is recommended after age 36. Pneumococcal polysaccharide (PPSV23) vaccine. One dose is recommended after age 8. Talk to your health  care provider about which screenings and vaccines  you need and how often you need them. This information is not intended to replace advice given to you by your health care provider. Make sure you discuss any questions you have with your health care provider. Document Released: 01/04/2016 Document Revised: 08/27/2016 Document Reviewed: 10/09/2015 Elsevier Interactive Patient Education  2017 [employer] Prevention in the Home Falls can cause injuries. They can happen to people of all ages. There are many things you can do to make your home safe and to help prevent falls. What can I do on the outside of my home? Regularly fix the edges of walkways and driveways and fix any cracks. Remove anything that might make you trip as you walk through a door, such as a raised step or threshold. Trim any bushes or trees on the path to your home. Use bright outdoor lighting. Clear any walking paths of anything that might make someone trip, such as rocks or tools. Regularly check to see if handrails are loose or broken. Make sure that both sides of any steps have handrails. Any raised decks and porches should have guardrails on the edges. Have any leaves, snow, or ice cleared regularly. Use sand or salt on walking paths during winter. Clean up any spills in your garage right away. This includes oil or grease spills. What can I do in the bathroom? Use night lights. Install grab bars by the toilet and in the tub and shower. Do not use towel bars as grab bars. Use non-skid mats or decals in the tub or shower. If you need to sit down in the shower, use a plastic, non-slip stool. Keep the floor dry. Clean up any water that spills on the floor as soon as it happens. Remove soap buildup in the tub or shower regularly. Attach bath mats securely with double-sided non-slip rug tape. Do not have throw rugs and other things on the floor that can make you trip. What can I do in the bedroom? Use night lights. Make sure that you have a light by your bed that  is easy to reach. Do not use any sheets or blankets that are too big for your bed. They should not hang down onto the floor. Have a firm chair that has side arms. You can use this for support while you get dressed. Do not have throw rugs and other things on the floor that can make you trip. What can I do in the kitchen? Clean up any spills right away. Avoid walking on wet floors. Keep items that you use a lot in easy-to-reach places. If you need to reach something above you, use a strong step stool that has a grab bar. Keep electrical cords out of the way. Do not use floor polish or wax that makes floors slippery. If you must use wax, use non-skid floor wax. Do not have throw rugs and other things on the floor that can make you trip. What can I do with my stairs? Do not leave any items on the stairs. Make sure that there are handrails on both sides of the stairs and use them. Fix handrails that are broken or loose. Make sure that handrails are as long as the stairways. Check any carpeting to make sure that it is firmly attached to the stairs. Fix any carpet that is loose or worn. Avoid having throw rugs at the top or bottom of the stairs. If you do have throw rugs, attach them to  the floor with carpet tape. Make sure that you have a light switch at the top of the stairs and the bottom of the stairs. If you do not have them, ask someone to add them for you. What else can I do to help prevent falls? Wear shoes that: Do not have high heels. Have rubber bottoms. Are comfortable and fit you well. Are closed at the toe. Do not wear sandals. If you use a stepladder: Make sure that it is fully opened. Do not climb a closed stepladder. Make sure that both sides of the stepladder are locked into place. Ask someone to hold it for you, if possible. Clearly mark and make sure that you can see: Any grab bars or handrails. First and last steps. Where the edge of each step is. Use tools that help you  move around (mobility aids) if they are needed. These include: Canes. Walkers. Scooters. Crutches. Turn on the lights when you go into a dark area. Replace any light bulbs as soon as they burn out. Set up your furniture so you have a clear path. Avoid moving your furniture around. If any of your floors are uneven, fix them. If there are any pets around you, be aware of where they are. Review your medicines with your doctor. Some medicines can make you feel dizzy. This can increase your chance of falling. Ask your doctor what other things that you can do to help prevent falls. This information is not intended to replace advice given to you by your health care provider. Make sure you discuss any questions you have with your health care provider. Document Released: 10/04/2009 Document Revised: 05/15/2016 Document Reviewed: 01/12/2015 Elsevier Interactive Patient Education  2017 [employer].

## 2021-06-13 ENCOUNTER — Telehealth: Payer: Self-pay | Admitting: Licensed Clinical Social Worker

## 2021-06-13 NOTE — Telephone Encounter (Signed)
    Clinical Social Work  Care Management   Phone Outreach    06/13/2021 Name: EZMA REHM MRN: [id number] DOB: [birth year]  DAPHENE CHISHOLM is a 80 y.o. year old female who is a primary care patient of Burchette, Alinda Sierras, MD .   CCM LCSW reached out to patient today by phone to introduce self, assess needs and offer Care Management services and interventions. Patient shared that she was unable to talk at the present time.  Plan: LCSW strongly encouraged patient to contact her when she is available. LCSW provided patient with direct contact information and received verbal consent from patient to make another attempt tomorrow.  Patient verbalized understanding.  Review of patient status, including review of consultants reports, relevant laboratory and other test results, and collaboration with appropriate care team members and the patient's provider was performed as part of comprehensive patient evaluation and provision of care management services.    Christa See, MSW, [email or web address] Phone [phone] 4:57 PM

## 2021-06-14 ENCOUNTER — Telehealth: Payer: Self-pay | Admitting: *Deleted

## 2021-06-14 NOTE — Chronic Care Management (AMB) (Signed)
  Chronic Care Management   Note  06/14/2021 Name: AYNSLEE MULHALL MRN: [id number] DOB: 02-Oct-1941  DRU LAUREL is a 80 y.o. year old female who is a primary care patient of Burchette, Alinda Sierras, MD. TEQULIA GONSALVES is currently enrolled in care management services. An additional referral for Social Worker  was placed.   Follow up plan: Unsuccessful telephone outreach attempt made. A HIPAA compliant phone message was left for the patient providing contact information and requesting a return call.  The care management team will reach out to the patient again over the next 7 days.  If patient returns call to provider office, please advise to call Adrian at [phone].  [place] Management

## 2021-06-18 NOTE — Chronic Care Management (AMB) (Signed)
  Chronic Care Management   Note  06/18/2021 Name: Heather Oneal MRN: [id number] DOB: 07/17/1941  Heather Oneal is a 80 y.o. year old female who is a primary care patient of Burchette, Alinda Sierras, MD. Heather Oneal is currently enrolled in care management services. An additional referral for SW was placed.   Follow up plan: Telephone appointment with care management team member scheduled for:SW will outreach on 6/30  [place] Management

## 2021-06-20 ENCOUNTER — Telehealth: Payer: Self-pay | Admitting: *Deleted

## 2021-06-20 NOTE — Telephone Encounter (Signed)
   06/20/2021  GRETHEL ZENK Sep 09, 1941 [id number]   Phone call to patient to introduce self, assess needs and offer Care Management services and interventions. Patient stated that she was not able to talk as her son was home unexpectedly.  This Education officer, museum confirmed that patient was not in any immediate danger at this time. She further confirmed no presence of weapons in the home. This social worker's contact number provided, patient agrees to contact this Education officer, museum tomorrow after 11am.   Elliot Gurney, [facility] [phone]

## 2021-06-21 ENCOUNTER — Ambulatory Visit (INDEPENDENT_AMBULATORY_CARE_PROVIDER_SITE_OTHER): Payer: PPO | Admitting: *Deleted

## 2021-06-21 DIAGNOSIS — F418 Other specified anxiety disorders: Secondary | ICD-10-CM

## 2021-06-21 DIAGNOSIS — F419 Anxiety disorder, unspecified: Secondary | ICD-10-CM

## 2021-06-21 DIAGNOSIS — F4321 Adjustment disorder with depressed mood: Secondary | ICD-10-CM | POA: Diagnosis not present
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
## 2021-07-03 ENCOUNTER — Ambulatory Visit: Payer: PPO | Attending: Audiologist | Admitting: Audiologist

## 2021-07-03 ENCOUNTER — Ambulatory Visit: Payer: Self-pay | Admitting: *Deleted

## 2021-07-03 ENCOUNTER — Other Ambulatory Visit: Payer: Self-pay

## 2021-07-03 DIAGNOSIS — F418 Other specified anxiety disorders: Secondary | ICD-10-CM

## 2021-07-03 DIAGNOSIS — F4321 Adjustment disorder with depressed mood: Secondary | ICD-10-CM

## 2021-07-03 DIAGNOSIS — H90A31 Mixed conductive and sensorineural hearing loss, unilateral, right ear with restricted hearing on the contralateral side: Secondary | ICD-10-CM | POA: Insufficient documentation

## 2021-07-03 DIAGNOSIS — H90A22 Sensorineural hearing loss, unilateral, left ear, with restricted hearing on the contralateral side: Secondary | ICD-10-CM | POA: Insufficient documentation

## 2021-07-03 DIAGNOSIS — F419 Anxiety disorder, unspecified: Secondary | ICD-10-CM

## 2021-07-03 NOTE — Patient Instructions (Signed)
Visit Information   Goals Addressed             This Visit's Progress    Keep Myself Safe in Relationships-Adult       Start date 06/21/21 Follow Up Date: Patient will follow up with assigned social worker    - accept and begin counseling - ask for help when needed - connect with a support person or friend - develop a personal safety plan - join a support group    Why is this important?   Being hurt by someone close to you is scary.  Learning to take care of yourself emotionally and physically is important, especially during stress.  Getting enough sleep and eating well will help you cope with tough times easier.  Having a plan to keep yourself safe is important.     Notes:          The patient verbalized understanding of instructions, educational materials, and care plan provided today and declined offer to receive copy of patient instructions, educational materials, and care plan.   Telephone follow up appointment with care management team member scheduled for: 07/07/21  Elliot Gurney, [facility] [phone]

## 2021-07-03 NOTE — Procedures (Signed)
  Outpatient Audiology and [facility] [place], [place]  [address] [phone]  AUDIOLOGICAL  EVALUATION  NAME: Heather Oneal     DOB:   Sep 08, 1941      MRN: [id number]                                                                                     DATE: 07/03/2021     REFERENT: Eulas Post, MD STATUS: Outpatient DIAGNOSIS:  Sensorineural hearing loss (SNHL) of left and Mixed conductive and sensorineural hearing loss of right ear   History: Heather Oneal was seen for an audiological evaluation.  Heather Oneal is receiving a hearing evaluation due to concerns for difficulty hearing people with masks on. Her daughter has also told her that Heather Oneal does not seem to hear well. Heather Oneal has difficulty hearing in church as well. This difficulty began gradually. No pain or tinnitus reported in either ear. She has occasional pressure in her ears which she feels is from allergies. Heather Oneal denied any noise exposure. No family history of hearing loss.  Medical history negative for any condition for hearing loss is a risk factor. No other relevant case history reported.    Evaluation:  Otoscopy showed a clear view of the tympanic membranes, bilaterally Right tympanic membrane was red Tympanometry results were consistent with shallow response in the right ear and normal pressure in the left ear.  Audiometric testing was completed using conventional audiometry with supraural and insert transducer. Speech Recognition Thresholds were consistent with pure tone averages. Word Recognition was excellent at an elevated level. Pure tone thresholds show normal sloping to severe hearing loss in both ears. Conductive component to hearing loss in the right ear only in low pitches from 250-1k Hz. Left ear hearing loss sensorineural.   Results:  The test results were reviewed with Heather Oneal. First she needs to see an ENT Physician due to the nature of her hearing loss in the right ear.  Then recommend a trial of hearing aids once medically cleared and right ear conductive component addressed.   Recommendations:  Amplification is necessary for both ears. Hearing aids can be purchased from a variety of locations. See provided list for locations in the [place] area.  Referral to ENT Physician necessary due to mixed asymmetric hearing loss. Eulas Post, MD please send a referral to Dr. Melony Overly, ENT and include this report and the scanned audiogram under media.    Alfonse Alpers  Audiologist, Au.D., CCC-A 07/03/2021  1:38 PM  Cc: Eulas Post, MD
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
## 2021-07-05 ENCOUNTER — Other Ambulatory Visit: Payer: Self-pay

## 2021-07-05 ENCOUNTER — Telehealth: Payer: PPO

## 2021-07-05 DIAGNOSIS — H902 Conductive hearing loss, unspecified: Secondary | ICD-10-CM

## 2021-07-05 NOTE — Progress Notes (Signed)
Referral has been placed. 

## 2021-07-08 ENCOUNTER — Telehealth: Payer: Self-pay | Admitting: *Deleted

## 2021-07-08 ENCOUNTER — Encounter: Payer: Self-pay | Admitting: *Deleted

## 2021-07-08 ENCOUNTER — Telehealth: Payer: PPO

## 2021-07-08 NOTE — Telephone Encounter (Signed)
  Care Management   Follow Up Note   07/08/2021 Name: Heather Oneal MRN: [id number] DOB: May 23, 1941   Referred by: Eulas Post, MD Reason for referral : Care Coordination   An unsuccessful telephone outreach was attempted today. The patient was referred to the case management team for assistance with care management and care coordination.   Follow Up Plan: Telephone follow up appointment with care management team member scheduled for:07/09/21   Sheralyn Boatman [facility] [phone]

## 2021-07-08 NOTE — Chronic Care Management (AMB) (Signed)
  Care Management   Follow Up Note   07/08/2021 Name: RAYLIN DIGUGLIELMO MRN: [id number] DOB: 09-02-1941   Referred by: Eulas Post, MD Reason for referral : Chronic Care Management   An unsuccessful telephone outreach was attempted today. The patient was referred to the case management team for assistance with care management and care coordination.   Follow Up Plan: Telephone follow up appointment with care management team member scheduled for:07/09/21  Jesslynn Kruck, [place]

## 2021-07-08 NOTE — Progress Notes (Signed)
This encounter was created in error - please disregard.

## 2021-07-09 ENCOUNTER — Telehealth: Payer: Self-pay | Admitting: *Deleted

## 2021-07-09 NOTE — Telephone Encounter (Signed)
  Care Management   Follow Up Note   07/09/2021 Name: Heather Oneal MRN: [id number] DOB: 1941/06/21   Referred by: Eulas Post, MD Reason for referral : Care Coordination   A second unsuccessful telephone outreach was attempted today. The patient was referred to the case management team for assistance with care management and care coordination.   Follow Up Plan: Telephone follow up appointment with care management team member scheduled for: 07/10/21  [place], [facility] [phone]

## 2021-07-11 ENCOUNTER — Ambulatory Visit: Payer: Self-pay | Admitting: *Deleted

## 2021-07-11 DIAGNOSIS — F418 Other specified anxiety disorders: Secondary | ICD-10-CM

## 2021-07-11 DIAGNOSIS — F4321 Adjustment disorder with depressed mood: Secondary | ICD-10-CM

## 2021-07-11 DIAGNOSIS — F419 Anxiety disorder, unspecified: Secondary | ICD-10-CM

## 2021-07-11 NOTE — Patient Instructions (Addendum)
Visit Information   Goals Addressed             This Visit's Progress    Keep Myself Safe in Relationships-Adult       Start date 06/21/21 Follow Up Date: 07/24/21   - accept and begin counseling - ask for help when needed - connect with a support person or friend - develop a personal safety plan - join a support group    Why is this important?   Being hurt by someone close to you is scary.  Learning to take care of yourself emotionally and physically is important, especially during stress.  Getting enough sleep and eating well will help you cope with tough times easier.  Having a plan to keep yourself safe is important.     Notes:         The patient verbalized understanding of instructions, educational materials, and care plan provided today and declined offer to receive copy of patient instructions, educational materials, and care plan.   Telephone follow up appointment with care management team member scheduled for: 07/08/21  Elliot Gurney, [facility] [phone]

## 2021-07-11 NOTE — Addendum Note (Signed)
Addended by: Vern Claude on: 07/11/2021 04:20 PM   Modules accepted: Orders, Level of Service, SmartSet
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
## 2021-07-11 NOTE — Progress Notes (Signed)
This encounter was created in error - please disregard.

## 2021-07-12 ENCOUNTER — Ambulatory Visit (INDEPENDENT_AMBULATORY_CARE_PROVIDER_SITE_OTHER): Payer: PPO | Admitting: Family Medicine

## 2021-07-12 ENCOUNTER — Other Ambulatory Visit: Payer: Self-pay

## 2021-07-12 VITALS — BP 130/62 | HR 90 | Temp 98.1°F | Wt 254.4 lb

## 2021-07-12 DIAGNOSIS — H9201 Otalgia, right ear: Secondary | ICD-10-CM | POA: Diagnosis not present

## 2021-07-12 DIAGNOSIS — J309 Allergic rhinitis, unspecified: Secondary | ICD-10-CM

## 2021-07-12 DIAGNOSIS — F339 Major depressive disorder, recurrent, unspecified: Secondary | ICD-10-CM

## 2021-07-12 MED ORDER — BUPROPION HCL ER (XL) 150 MG PO TB24: 150.0000 mg | ORAL_TABLET | Freq: Every day | ORAL | 1 refills | Status: DC

## 2021-07-12 NOTE — Patient Instructions (Addendum)
Consider over the counter allergy medications such as Flonase nasal and anti-histamine such as Allegra or Claritan (these are all generally non-sedating).
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
## 2021-07-19 ENCOUNTER — Telehealth: Payer: Self-pay | Admitting: Family Medicine

## 2021-07-19 ENCOUNTER — Other Ambulatory Visit: Payer: Self-pay

## 2021-07-19 MED ORDER — OLOPATADINE HCL 0.1 % OP SOLN: 1.0000 [drp] | Freq: Two times a day (BID) | OPHTHALMIC | 3 refills | Status: DC | PRN

## 2021-07-19 NOTE — Telephone Encounter (Signed)
Patient aware, refill for Olopatadine 0.1% sent to [facility] on [address].

## 2021-07-19 NOTE — Telephone Encounter (Signed)
Pt is calling in needing a refill on Rx olopatadine (PATANOL) 0.1%  Pharm: [facility] on [address]

## 2021-07-24 ENCOUNTER — Telehealth: Payer: Self-pay

## 2021-07-24 NOTE — Telephone Encounter (Signed)
Spoke with the patient. She stated she has been having issues with her both eye being red and itchy and unable to open the eye all the way. Left eye is the worst. Was given a Rx eyed drop last week, some improvement but still very red.

## 2021-07-24 NOTE — Telephone Encounter (Signed)
Spoke with the patient. Appointment has been scheduled.  

## 2021-07-24 NOTE — Telephone Encounter (Signed)
Patient called stating that her eye has not gotten any better since her last visit pt would like a call back on what she should do.

## 2021-07-25 ENCOUNTER — Other Ambulatory Visit: Payer: Self-pay

## 2021-07-26 ENCOUNTER — Encounter: Payer: Self-pay | Admitting: Family Medicine

## 2021-07-26 ENCOUNTER — Ambulatory Visit (INDEPENDENT_AMBULATORY_CARE_PROVIDER_SITE_OTHER): Payer: PPO | Admitting: Family Medicine

## 2021-07-26 VITALS — BP 110/64 | HR 87 | Temp 98.0°F | Ht 67.0 in | Wt 253.9 lb

## 2021-07-26 DIAGNOSIS — H1013 Acute atopic conjunctivitis, bilateral: Secondary | ICD-10-CM

## 2021-07-26 DIAGNOSIS — F5102 Adjustment insomnia: Secondary | ICD-10-CM

## 2021-07-26 MED ORDER — OLOPATADINE HCL 0.1 % OP SOLN: 1.0000 [drp] | Freq: Two times a day (BID) | OPHTHALMIC | 3 refills | Status: AC | PRN
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
## 2021-08-01 ENCOUNTER — Ambulatory Visit (INDEPENDENT_AMBULATORY_CARE_PROVIDER_SITE_OTHER): Payer: PPO | Admitting: *Deleted

## 2021-08-01 DIAGNOSIS — F419 Anxiety disorder, unspecified: Secondary | ICD-10-CM

## 2021-08-01 DIAGNOSIS — F339 Major depressive disorder, recurrent, unspecified: Secondary | ICD-10-CM | POA: Diagnosis not present

## 2021-08-01 DIAGNOSIS — F418 Other specified anxiety disorders: Secondary | ICD-10-CM
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
## 2021-08-01 NOTE — Chronic Care Management (AMB) (Signed)
Chronic Care Management    Clinical Social Work Note  08/01/2021 Name: Heather Oneal MRN: [id number] DOB: March 11, 1941  Heather Oneal is a 80 y.o. year old female who is a primary care patient of Burchette, Alinda Sierras, MD. The CCM team was consulted to assist the patient with chronic disease management and/or care coordination needs related to: [place] and Resources.   Engaged with patient by telephone for follow up visit in response to provider referral for social work chronic care management and care coordination services.   Consent to Services:  The patient was given information about Chronic Care Management services, agreed to services, and gave verbal consent prior to initiation of services.  Please see initial visit note for detailed documentation.   Patient agreed to services and consent obtained.   Assessment: Review of patient past medical history, allergies, medications, and health status, including review of relevant consultants reports was performed today as part of a comprehensive evaluation and provision of chronic care management and care coordination services.     SDOH (Social Determinants of Health) assessments and interventions performed:    Advanced Directives Status: Not addressed in this encounter.  CCM Care Plan  Allergies  Allergen Reactions   Crestor [Rosuvastatin]     " Ears itching and throat burning"    Outpatient Encounter Medications as of 08/01/2021  Medication Sig Note   atorvastatin (LIPITOR) 20 MG tablet Take 1 tablet (20 mg total) by mouth daily.    buPROPion (WELLBUTRIN XL) 150 MG 24 hr tablet Take 1 tablet (150 mg total) by mouth daily.    guaiFENesin (MUCINEX PO) Take by mouth. 07/11/2019: Take as needed   Multiple Vitamin (MULTIVITAMIN) tablet Take 1 tablet by mouth daily.    olopatadine (PATANOL) 0.1 % ophthalmic solution Place 1 drop into both eyes 2 (two) times daily as needed for allergies.    No facility-administered  encounter medications on file as of 08/01/2021.    Patient Active Problem List   Diagnosis Date Noted   Venous stasis 07/20/2019   Obesity (BMI 30-39.9) 11/23/2013   Allergic rhinitis 11/23/2013   Leg pain, left 04/10/2011   CHEST PAIN-PRECORDIAL 02/10/2011   HYPERLIPIDEMIA 01/28/2010   DEPRESSION 01/28/2010   SCIATICA, LEFT 01/28/2010   IBS (irritable bowel syndrome) 01/28/2010    Conditions to be addressed/monitored: Anxiety and Depression; Mental Health Concerns  and Family and relationship dysfunction  Care Plan : General Social Work (Adult)  Updates made by Vern Claude, LCSW since 08/01/2021 12:00 AM     Problem: Home and Family Safety (Wellness)      Goal: Home and Family Safety Maintained   Start Date: 06/21/2021  Expected End Date: 01/22/2022  This Visit's Progress: On track  Recent Progress: On track  Priority: High  Note:   Current Barriers:  Chronic Mental Health needs related to conflicts with family conflicts Family and relationship dysfunction Suicidal Ideation/Homicidal Ideation: No  Clinical Social Work Goal(s):  Over the next 90 days, patient will work with SW bi-weekly by telephone or in person to manage symptoms related to family conflict  Interventions:  Patient interviewed and appropriate assessments performed Patient's safety confirmed stating that patient's son traveled back to [place] and is still there-per patient son remaining in  [place] is beneficial. Moving re-visited-however patient is reluctant to move to [facility] and has decided to remain in her apartment-patient now making efforts to downsize her home Per patient, restraining order not necessary as patient is now out of  state  Explored possibility of patient's son's returned and   strategies to reduce risk   Returning to [place] explored Patient continues to confirm having a supportive friend/church family-patient re-connecting with her daughter but has reservations  Patient  states that she is now taking a anti-depressant which she is finding beneficial, remaining involved in her church also discussed This Education officer, museum discussed continuing to consider options, with an emphasis on safety and self care Verbalization of feelings encouraged Active listening / Reflection utilized Emotional Support Provided  Participation in counseling/ala-non encouraged  Utilizing safety plan when needed discussed, including involvement of law enforcement Patient verbalized having no community resource needs at this time, this Education officer, museum provided patient with the contact number of any additional needs may arise in the future.  Patient Self Care Activities:  Performs ADL's independently Performs IADL's independently Strong social support  Patient Coping Strengths:  Supportive Relationships Church Spirituality Hopefulness Able to Communicate Effectively  Patient Self Care Deficits:  Knowledge deficit in regards to available community resources to manage family dysfunction  Please see past updates related to this goal by clicking on the "Past Updates" button in the selected goal         Follow Up Plan: Client will call this social worker with any additional community resource needs      [place], [place] Worker  [place] [phone]

## 2021-08-02 DIAGNOSIS — E669 Obesity, unspecified: Secondary | ICD-10-CM | POA: Diagnosis not present

## 2021-08-02 DIAGNOSIS — Z1231 Encounter for screening mammogram for malignant neoplasm of breast: Secondary | ICD-10-CM | POA: Diagnosis not present

## 2021-08-02 DIAGNOSIS — N393 Stress incontinence (female) (male): Secondary | ICD-10-CM | POA: Diagnosis not present

## 2021-08-02 DIAGNOSIS — Z124 Encounter for screening for malignant neoplasm of cervix: Secondary | ICD-10-CM | POA: Diagnosis not present

## 2021-08-02 DIAGNOSIS — N951 Menopausal and female climacteric states: Secondary | ICD-10-CM | POA: Diagnosis not present

## 2021-08-06 ENCOUNTER — Other Ambulatory Visit (INDEPENDENT_AMBULATORY_CARE_PROVIDER_SITE_OTHER): Payer: PPO

## 2021-08-06 ENCOUNTER — Other Ambulatory Visit: Payer: Self-pay

## 2021-08-06 DIAGNOSIS — E785 Hyperlipidemia, unspecified: Secondary | ICD-10-CM

## 2021-08-06 LAB — HEPATIC FUNCTION PANEL
ALT: 13 U/L (ref 0–35)
AST: 14 U/L (ref 0–37)
Albumin: 4 g/dL (ref 3.5–5.2)
Alkaline Phosphatase: 79 U/L (ref 39–117)
Bilirubin, Direct: 0.1 mg/dL (ref 0.0–0.3)
Total Bilirubin: 0.8 mg/dL (ref 0.2–1.2)
Total Protein: 6.7 g/dL (ref 6.0–8.3)

## 2021-08-07 ENCOUNTER — Telehealth: Payer: Self-pay | Admitting: Family Medicine

## 2021-08-07 NOTE — Telephone Encounter (Signed)
Pt return your call and want a call back.

## 2021-08-07 NOTE — Telephone Encounter (Signed)
See result notes. 

## 2021-08-14 ENCOUNTER — Telehealth: Payer: Self-pay | Admitting: Pharmacist

## 2021-08-14 NOTE — Chronic Care Management (AMB) (Signed)
Chronic Care Management Pharmacy Assistant   Name: Heather Oneal  MRN: [id number] DOB: 17-Jan-1941  Reason for Encounter: Disease State/ Hyperlipidemia Assessment Call.   Conditions to be addressed/monitored: HLD   Recent office visits:  08/01/21  Chrystal Land LCSW [facility] CCM) - seen for depression. No medication changes and no further follow up required.  07/26/21  Carolann Littler MD (PCP) -  seen for allergic conjuctivitis. No medication changes. Follow up as needed.   07/12/21 Carolann Littler MD (PCP) -  seen for right ear pain and other issues. Patient started on bupropion '150mg'$  daily. Follow up in 1 month.   Recent consult visits:  None.  Hospital visits:  None in previous 6 months  Medications: Outpatient Encounter Medications as of 08/14/2021  Medication Sig Note   atorvastatin (LIPITOR) 20 MG tablet Take 1 tablet (20 mg total) by mouth daily.    buPROPion (WELLBUTRIN XL) 150 MG 24 hr tablet Take 1 tablet (150 mg total) by mouth daily.    guaiFENesin (MUCINEX PO) Take by mouth. 07/11/2019: Take as needed   Multiple Vitamin (MULTIVITAMIN) tablet Take 1 tablet by mouth daily.    olopatadine (PATANOL) 0.1 % ophthalmic solution Place 1 drop into both eyes 2 (two) times daily as needed for allergies.    No facility-administered encounter medications on file as of 08/14/2021.   Fill History: ATORVASTATIN '20MG'$    TAB 07/26/2021 90   OLOPATADINE HCL 0.1% SOLUTION 08/14/2021 19   BUPROPION HYDROCHLORIDE ER (XL) '150MG'$  TABLET EXTENDED RELEASE 24 HOUR 08/13/2021 30   Comprehensive medication review performed; Spoke to patient regarding cholesterol  Lipid Panel    Component Value Date/Time   CHOL 225 (H) 02/01/2021 0934   TRIG 203.0 (H) 02/01/2021 0934   HDL 49.80 02/01/2021 0934   LDLCALC 180 (H) 10/24/2020 1112   LDLDIRECT 147.0 02/01/2021 0934    10-year ASCVD risk score: The 10-year ASCVD risk score Mikey Bussing DC Brooke Bonito., et al., 2013) is: 18.7%   Values used to  calculate the score:     Age: 80 years     Sex: Female     Is Non-Hispanic African American: No     Diabetic: No     Tobacco smoker: No     Systolic Blood Pressure: [id number] mmHg     Is BP treated: No     HDL Cholesterol: 49.8 mg/dL     Total Cholesterol: 225 mg/dL  Current antihyperlipidemic regimen:  Atorvastatin '20mg'$  - 1 tablet daily. Previous antihyperlipidemic medications tried: Crestor (ears itching/ burning) ASCVD risk enhancing conditions: age >46 What recent interventions/DTPs have been made by any provider to improve Cholesterol control since last CPP Visit: None. Any recent hospitalizations or ED visits since last visit with CPP? No  Adherence Review: Does the patient have >5 day gap between last estimated fill dates? No  Notes: Spoke with patient and reviewed medications as prescribed. Patient reports taking all medications and no issues at this time. Patient stated she is trying to eat foods lower in fat and she has tuna a couple times a week to help improve her cholesterol. Patient does not add salt to her food but she dies have frozen TV  dinners for dinner a couple times a week which she knows are high in cholesterol and sodium content. Patient usually has some wheat cereal with milk in the mornings and she has a serving of fresh fruit everyday. Patient stated she likes hot teas and she needs to improve her water intake.  Patient stated she helps at her church for activity cleaning the benches and podiums and she is still able to do things around her house with no issues or help. Patient did say she could do more but just hasn't lately. Patient thanked me for my call.   Care Gaps:  AWV - message sent to Ramond Craver CMA to schedule Zoster vaccines - never done Covid-19 vaccine booster 3 - overdue since 08/20/20 Flu vaccine - due  Star Rating Drugs:  Atorvastatin '20mg'$  - last filled on 07/26/21 90DS at [facility] Pharmacist Assistant [phone]

## 2021-08-20 ENCOUNTER — Telehealth: Payer: Self-pay | Admitting: Family Medicine

## 2021-08-20 ENCOUNTER — Ambulatory Visit (INDEPENDENT_AMBULATORY_CARE_PROVIDER_SITE_OTHER): Payer: PPO | Admitting: Otolaryngology

## 2021-08-20 ENCOUNTER — Other Ambulatory Visit: Payer: Self-pay

## 2021-08-20 DIAGNOSIS — H9071 Mixed conductive and sensorineural hearing loss, unilateral, right ear, with unrestricted hearing on the contralateral side: Secondary | ICD-10-CM

## 2021-08-20 DIAGNOSIS — E785 Hyperlipidemia, unspecified: Secondary | ICD-10-CM

## 2021-08-20 DIAGNOSIS — H903 Sensorineural hearing loss, bilateral: Secondary | ICD-10-CM

## 2021-08-20 DIAGNOSIS — H6123 Impacted cerumen, bilateral: Secondary | ICD-10-CM | POA: Diagnosis not present

## 2021-08-20 NOTE — Telephone Encounter (Signed)
-----   Message from Viona Gilmore, [facility] sent at 08/17/2021 12:43 AM EDT ----- Regarding: Repeat lipid panel Hi,  It seems as though Heather Oneal is taking her atorvastatin much more consistently now. Can we get a repeat lipid panel? Let me know and I will call her to schedule.   Thanks, Maddie

## 2021-08-20 NOTE — Progress Notes (Signed)
HPI: Heather Oneal is a 80 y.o. female who presents is referred by [facility] for evaluation of asymmetric hearing loss.  Patient has noticed gradual decline in her hearing and is had more trouble hearing people recently.  She brings with her a hearing test that showed downsloping sensorineural hearing loss in both ears with a mixed loss in the right ear in the lower frequencies.  In the mid frequencies and above at 2000 frequency and above hearing was symmetric in both ears and was downsloping.  In the lower frequencies she has slightly worse bone scores in the right ear compared to the left with a slight conductive loss.  She had type A tympanograms bilaterally but had an As on the right side. She was also told that she had a red spot on her eardrum on the right side.  Past Medical History:  Diagnosis Date   Allergy    seasonal   Anemia    as a teenager   Anxiety    Depression    DEPRESSION 01/28/2010   History of IBS    Hyperlipidemia    HYPERLIPIDEMIA 01/28/2010   Past Surgical History:  Procedure Laterality Date   APPENDECTOMY  1973   CHOLECYSTECTOMY  2008   COLONOSCOPY     OVARIAN CYST SURGERY  1973   ruptured ovarian cyst   POLYPECTOMY     TONSILLECTOMY  1948   Social History   Socioeconomic History   Marital status: Married    Spouse name: Not on file   Number of children: Not on file   Years of education: Not on file   Highest education level: Not on file  Occupational History   Not on file  Tobacco Use   Smoking status: Never   Smokeless tobacco: Never  Vaping Use   Vaping Use: Never used  Substance and Sexual Activity   Alcohol use: Yes    Comment: rarely   Drug use: No   Sexual activity: Not on file  Other Topics Concern   Not on file  Social History Narrative   Not on file   Social Determinants of Health   Financial Resource Strain: Medium Risk   Difficulty of Paying Living Expenses: Somewhat hard  Food Insecurity: No Food Insecurity   Worried  About Running Out of Food in the Last Year: Never true   Ran Out of Food in the Last Year: Never true  Transportation Needs: No Transportation Needs   Lack of Transportation (Medical): No   Lack of Transportation (Non-Medical): No  Physical Activity: Inactive   Days of Exercise per Week: 0 days   Minutes of Exercise per Session: 0 min  Stress: No Stress Concern Present   Feeling of Stress : Not at all  Social Connections: Moderately Integrated   Frequency of Communication with Friends and Family: Twice a week   Frequency of Social Gatherings with Friends and Family: Twice a week   Attends Religious Services: 1 to 4 times per year   Active Member of [employer] or Organizations: Yes   Attends Archivist Meetings: 1 to 4 times per year   Marital Status: Widowed   Family History  Problem Relation Age of Onset   Heart disease Mother    Hypertension Mother    Cancer Sister 38       breast   Crohn's disease Brother    Diabetes Maternal Grandmother    Heart disease Paternal Grandfather    Stomach cancer Paternal Grandmother  Colon cancer Neg Hx    Pancreatic cancer Neg Hx    Rectal cancer Neg Hx    Esophageal cancer Neg Hx    Colon polyps Neg Hx    Allergies  Allergen Reactions   Crestor [Rosuvastatin]     " Ears itching and throat burning"   Prior to Admission medications   Medication Sig Start Date End Date Taking? Authorizing Provider  atorvastatin (LIPITOR) 20 MG tablet Take 1 tablet (20 mg total) by mouth daily. 02/06/21   Burchette, Alinda Sierras, MD  buPROPion (WELLBUTRIN XL) 150 MG 24 hr tablet Take 1 tablet (150 mg total) by mouth daily. 07/12/21   Burchette, Alinda Sierras, MD  guaiFENesin (MUCINEX PO) Take by mouth.    [provider]  Multiple Vitamin (MULTIVITAMIN) tablet Take 1 tablet by mouth daily.    [provider]  olopatadine (PATANOL) 0.1 % ophthalmic solution Place 1 drop into both eyes 2 (two) times daily as needed for allergies. 07/26/21    Burchette, Alinda Sierras, MD     Positive ROS: Otherwise negative  All other systems have been reviewed and were otherwise negative with the exception of those mentioned in the HPI and as above.  Physical Exam: Constitutional: Alert, well-appearing, no acute distress Ears: External ears without lesions or tenderness.  She has some wax buildup in both ear canals that was cleaned with curette.  The left TM was clear with good mobility on pneumatic otoscopy.  The right TM likewise was clear with reasonable mobility on pneumatic otoscopy and no middle ear space abnormality noted.  On tuning fork testing with a 512 tuning fork Weber was midline to the patient and [facility] was greater than BC on the left side and AC equivocal to [facility] on the right side Nasal: External nose without lesions.. Clear nasal passages Oral: Lips and gums without lesions. Tongue and palate mucosa without lesions. Posterior oropharynx clear. Neck: No palpable adenopathy or masses Respiratory: Breathing comfortably  Skin: No facial/neck lesions or rash noted.  Cerumen impaction removal  Date/Time: 08/20/2021 2:15 PM Performed by: Rozetta Nunnery, MD Authorized by: Rozetta Nunnery, MD   Consent:    Consent obtained:  Verbal   Consent given by:  Patient   Risks discussed:  Pain and bleeding Procedure details:    Location:  L ear and R ear   Procedure type: curette   Post-procedure details:    Inspection:  TM intact and canal normal   Hearing quality:  Improved   Procedure completion:  Tolerated well, no immediate complications Comments:     Minimal wax buildup on both sides was cleaned with curettes.  TMs were otherwise clear with no TM abnormality noted or middle ear space abnormality noted.  Assessment: Mixed hearing loss in the lower frequencies on the right side with symmetric downsloping sensorineural hearing loss in both ears in the upper frequencies   Plan: Reviewed with her concerning need for hearing  aids.  She may have some mild ossicular problems on the right side that may be causing a low-frequency conductive loss but would still need hearing aids. Refer her to get hearing aids.   Radene Journey, MD   CC:

## 2021-09-11 ENCOUNTER — Other Ambulatory Visit: Payer: Self-pay | Admitting: Family Medicine

## 2021-10-30 ENCOUNTER — Telehealth: Payer: Self-pay | Admitting: Pharmacist

## 2021-10-30 NOTE — Chronic Care Management (AMB) (Signed)
    Chronic Care Management Pharmacy Assistant   Name: OAKLYNN STIERWALT  MRN: [id number] DOB: 1941-04-12   Reason for Encounter: Disease State/ Hyperlipidemia Assessment Call.   Conditions to be addressed/monitored: HLD  Primary concerns for visit include: Medication Adherence for Atorvastatin.  Recent office visits:  None.  Recent consult visits:  08/20/21 Melony Overly MD (Otolaryngology) - seen for sensorineural hearing loss of both ears. No medication changes. Referred to get hearing aids. No follow up noted.  Hospital visits:  None in previous 6 months  Medications: Outpatient Encounter Medications as of 10/30/2021  Medication Sig Note   atorvastatin (LIPITOR) 20 MG tablet Take 1 tablet (20 mg total) by mouth daily.    buPROPion (WELLBUTRIN XL) 150 MG 24 hr tablet TAKE 1 TABLET BY MOUTH EVERY DAY    guaiFENesin (MUCINEX PO) Take by mouth. 07/11/2019: Take as needed   Multiple Vitamin (MULTIVITAMIN) tablet Take 1 tablet by mouth daily.    olopatadine (PATANOL) 0.1 % ophthalmic solution Place 1 drop into both eyes 2 (two) times daily as needed for allergies.    No facility-administered encounter medications on file as of 10/30/2021.   Fill History: ATORVASTATIN 20MG    TAB 07/26/2021 90   BUPROPION HCL XL 150 MG TABLET 10/16/2021 30     Comprehensive medication review performed; Spoke to patient regarding cholesterol  Lipid Panel    Component Value Date/Time   CHOL 225 (H) 02/01/2021 0934   TRIG 203.0 (H) 02/01/2021 0934   HDL 49.80 02/01/2021 0934   LDLCALC 180 (H) 10/24/2020 1112   LDLDIRECT 147.0 02/01/2021 0934    10-year ASCVD risk score: The ASCVD Risk score (Arnett DK, et al., 2019) failed to calculate for the following reasons:   The 2019 ASCVD risk score is only valid for ages 62 to 28  Current antihyperlipidemic regimen:  Atorvastatin 20mg  - take 1 tablet by mouth daily. Previous antihyperlipidemic medications tried: Crestor (ears itching/  burning) ASCVD risk enhancing conditions: age >74 What recent interventions/DTPs have been made by any provider to improve Cholesterol control since last CPP Visit: None. Any recent hospitalizations or ED visits since last visit with CPP? No  Adherence Review: Does the patient have >5 day gap between last estimated fill dates? No   Unable to reach patient.  Care Gaps:  AWV - message sent to Ramond Craver CMA to schedule Zoster vaccines - never done Covid-19 vaccine booster 3 - overdue since 08/20/20 Flu vaccine - due  Star Rating Drugs:  Atorvastatin 20mg  - last filled on 07/26/21 90DS at [facility] Pharmacist Assistant [phone]

## 2021-11-12 ENCOUNTER — Telehealth: Payer: Self-pay | Admitting: Family Medicine

## 2021-11-12 ENCOUNTER — Telehealth: Payer: Self-pay

## 2021-11-12 MED ORDER — ATORVASTATIN CALCIUM 20 MG PO TABS: 20.0000 mg | ORAL_TABLET | Freq: Every day | ORAL | 1 refills | Status: AC

## 2021-11-12 NOTE — Telephone Encounter (Signed)
--  Caller states she has been having episodes of lightheadedness since Saturday. She had another episode today but not at the moment. She has been having continuous soft bowel movements today.  11/11/2021 5:38:23 PM See PCP within 24 Hours Du, RN, [place] Understands Yes  Referrals REFERRED TO PCP OFFICE  11/12/21 at 1105: No appt noted at this time. Attempted to call pt but unable to LVM. Pt needs appt scheduled with PCP or any available provider.

## 2021-11-12 NOTE — Telephone Encounter (Signed)
Patient called because she is out of refills for atorvastatin (LIPITOR) 20 MG tablet    Please send to [place], [place] Phone:  [phone]  Fax:  [phone]      Please advise

## 2021-11-12 NOTE — Telephone Encounter (Signed)
ATC, unable to leave a message.  

## 2021-11-12 NOTE — Telephone Encounter (Signed)
Noted  

## 2021-11-12 NOTE — Telephone Encounter (Signed)
Rx sent in. Spoke with the patient and she is aware. 

## 2021-11-13 ENCOUNTER — Other Ambulatory Visit: Payer: Self-pay

## 2021-11-13 ENCOUNTER — Ambulatory Visit: Payer: PPO | Admitting: Nurse Practitioner

## 2021-11-13 ENCOUNTER — Encounter (HOSPITAL_COMMUNITY): Payer: Self-pay

## 2021-11-13 ENCOUNTER — Emergency Department (HOSPITAL_COMMUNITY)
Admission: EM | Admit: 2021-11-13 | Discharge: 2021-11-14 | Disposition: A | Discharge status: Home / Self Care | Payer: PPO | Attending: Emergency Medicine | Admitting: Emergency Medicine

## 2021-11-13 ENCOUNTER — Emergency Department (HOSPITAL_COMMUNITY): Payer: PPO

## 2021-11-13 DIAGNOSIS — Z20822 Contact with and (suspected) exposure to covid-19: Secondary | ICD-10-CM | POA: Diagnosis not present

## 2021-11-13 DIAGNOSIS — R0602 Shortness of breath: Secondary | ICD-10-CM | POA: Diagnosis not present

## 2021-11-13 LAB — CBC WITH DIFFERENTIAL/PLATELET
Abs Immature Granulocytes: 0.01 10*3/uL (ref 0.00–0.07)
Basophils Absolute: 0 10*3/uL (ref 0.0–0.1)
Basophils Relative: 1 %
Eosinophils Absolute: 0.2 10*3/uL (ref 0.0–0.5)
Eosinophils Relative: 2 %
HCT: 41.8 % (ref 36.0–46.0)
Hemoglobin: 13.3 g/dL (ref 12.0–15.0)
Immature Granulocytes: 0 %
Lymphocytes Relative: 28 %
Lymphs Abs: 1.9 10*3/uL (ref 0.7–4.0)
MCH: 29.2 pg (ref 26.0–34.0)
MCHC: 31.8 g/dL (ref 30.0–36.0)
MCV: 91.7 fL (ref 80.0–100.0)
Monocytes Absolute: 0.7 10*3/uL (ref 0.1–1.0)
Monocytes Relative: 11 %
Neutro Abs: 3.8 10*3/uL (ref 1.7–7.7)
Neutrophils Relative %: 58 %
Platelets: 333 10*3/uL (ref 150–400)
RBC: 4.56 MIL/uL (ref 3.87–5.11)
RDW: 14.7 % (ref 11.5–15.5)
WBC: 6.6 10*3/uL (ref 4.0–10.5)
nRBC: 0 % (ref 0.0–0.2)

## 2021-11-13 LAB — BASIC METABOLIC PANEL
Anion gap: 6 (ref 5–15)
BUN: 13 mg/dL (ref 8–23)
CO2: 29 mmol/L (ref 22–32)
Calcium: 9.4 mg/dL (ref 8.9–10.3)
Chloride: 102 mmol/L (ref 98–111)
Creatinine, Ser: 0.74 mg/dL (ref 0.44–1.00)
GFR, Estimated: >60 mL/min (ref >60)
Glucose, Bld: 96 mg/dL (ref 70–99)
Potassium: 4.1 mmol/L (ref 3.5–5.1)
Sodium: 137 mmol/L (ref 135–145)

## 2021-11-13 LAB — TROPONIN I (HIGH SENSITIVITY)
Troponin I (High Sensitivity): 4 ng/L (ref <18)
Troponin I (High Sensitivity): 4 ng/L (ref <18)

## 2021-11-13 LAB — D-DIMER, QUANTITATIVE: D-Dimer, Quant: 0.47 ug/mL-FEU (ref 0.00–0.50)

## 2021-11-13 LAB — RESP PANEL BY RT-PCR (FLU A&B, COVID) ARPGX2
Influenza A by PCR: NEGATIVE
Influenza B by PCR: NEGATIVE
SARS Coronavirus 2 by RT PCR: NEGATIVE

## 2021-11-13 NOTE — ED Notes (Signed)
ED Provider at bedside. 

## 2021-11-13 NOTE — ED Notes (Signed)
Pt denies SOB at this time. Pt states it comes and goes with exertion. Pt 99% RA at this time.

## 2021-11-13 NOTE — ED Provider Notes (Signed)
Emergency Medicine Provider Triage Evaluation Note  Heather Oneal , a 80 y.o. female  was evaluated in triage.  Pt complains of shortness of breath and fatigue.  Patient reports over the past year she has been having increasing episodes of shortness of breath they have been worse in particular over the last few weeks and she gets short of breath with any activity and just feels exhausted.  She tried to schedule an appointment with her doctor for this today.  Sent her to a new office and she spent 45 minutes driving around trying to find it and had to just go home because she felt so exhausted she does not feel that she could go to the appointment.  She does endorse having anxiety and that she has been dealing with a lot over the past year after dealing with her husband passing away and her son living with her.  Review of Systems  Positive: Shortness of breath, fatigue, anxiety Negative: Chest pain, abdominal pain, fevers  Physical Exam  BP (!) 155/97   Pulse 87   Temp 98.2 F (36.8 C) (Oral)   Resp 18   SpO2 99%  Gen:   Awake, no distress   Resp:  Normal effort, CTA bilat MSK:   Moves extremities without difficulty  Other:    Medical Decision Making  Medically screening exam initiated at 5:44 PM.  Appropriate orders placed.  Heather Oneal was informed that the remainder of the evaluation will be completed by another provider, this initial triage assessment does not replace that evaluation, and the importance of remaining in the ED until their evaluation is complete.     Jacqlyn Larsen, PA-C 11/13/21 1756    Sherwood Gambler, MD 11/13/21 2318

## 2021-11-13 NOTE — Discharge Instructions (Signed)
You were negative for signs of heart failure and blood clots. Please follow up with your PCP for more evaluation.

## 2021-11-13 NOTE — ED Notes (Signed)
Pt called for blood draw. No response.

## 2021-11-13 NOTE — ED Triage Notes (Signed)
Pt arrived via POV, states she was on way to appt, could not find address, by the time back to apt for correct address, pt had missed time. C/o unsteady gait and episodes of anxiety

## 2021-11-13 NOTE — ED Provider Notes (Signed)
I provided a substantive portion of the care of this patient.  I personally performed the entirety of the medical decision making for this encounter.      ED ECG REPORT   Date: 11/13/2021  Rate: 80  Rhythm: normal sinus rhythm  QRS Axis: normal  Intervals: normal  ST/T Wave abnormalities: normal  Conduction Disutrbances:none  Narrative Interpretation:   Old EKG Reviewed: none available  I have personally reviewed the EKG tracing and agree with the computerized printout as noted.   80 year old female presents with ongoing shortness of breath and fatigue for several months.  Denies any anginal or CHF type symptoms but did have some chest discomfort.  States that she is had this Thing when she gets emotionally upset and That way today when she was trying to find the doctor's office.  Plan will be to check D-dimer and cycle troponins and likely discharge   Lacretia Leigh, MD 11/13/21 2234

## 2021-11-14 NOTE — ED Provider Notes (Signed)
[facility] DEPT Provider Note   CSN: [id number] Arrival date & time: 11/13/21  1626     History Chief Complaint  Patient presents with   Shortness of Breath    Heather Oneal is a 80 y.o. female with history of anxiety and depression who presents for evaluation of increasing shortness of breath.  Shortness of breath is been a chronic issue for patient over the last several months to years, however she has been feeling as if she can only walk short distances before she needs to take a break to breathe.  Similarly she mentions unilateral left leg swelling that has been a chronic issue for several years, but has been worsening in the last couple of weeks.  Patient has been enduring significant life events, including her husband dying several months ago along with having her son back in the house who has a history of mental disease and substance abuse.  She has no history of heart disease, COPD, or any other major illnesses.  Her only daily medication are Wellbutrin and atorvastatin.  She denies fevers, chills recent illness chest pain, abdominal pain nausea, vomiting/diarrhea, headaches.   Shortness of Breath Associated symptoms: no abdominal pain, no fever, no headaches, no rash and no vomiting       Past Medical History:  Diagnosis Date   Allergy    seasonal   Anemia    as a teenager   Anxiety    Depression    DEPRESSION 01/28/2010   History of IBS    Hyperlipidemia    HYPERLIPIDEMIA 01/28/2010    Patient Active Problem List   Diagnosis Date Noted   Venous stasis 07/20/2019   Obesity (BMI 30-39.9) 11/23/2013   Allergic rhinitis 11/23/2013   Leg pain, left 04/10/2011   CHEST PAIN-PRECORDIAL 02/10/2011   HYPERLIPIDEMIA 01/28/2010   DEPRESSION 01/28/2010   SCIATICA, LEFT 01/28/2010   IBS (irritable bowel syndrome) 01/28/2010    Past Surgical History:  Procedure Laterality Date   APPENDECTOMY  1973   CHOLECYSTECTOMY  2008   COLONOSCOPY      OVARIAN CYST SURGERY  1973   ruptured ovarian cyst   POLYPECTOMY     TONSILLECTOMY  1948     OB History   No obstetric history on file.     Family History  Problem Relation Age of Onset   Heart disease Mother    Hypertension Mother    Cancer Sister 5       breast   Crohn's disease Brother    Diabetes Maternal Grandmother    Heart disease Paternal Grandfather    Stomach cancer Paternal Grandmother    Colon cancer Neg Hx    Pancreatic cancer Neg Hx    Rectal cancer Neg Hx    Esophageal cancer Neg Hx    Colon polyps Neg Hx     Social History   Tobacco Use   Smoking status: Never   Smokeless tobacco: Never  Vaping Use   Vaping Use: Never used  Substance Use Topics   Alcohol use: Yes    Comment: rarely   Drug use: No    Home Medications Prior to Admission medications   Medication Sig Start Date End Date Taking? Authorizing Provider  atorvastatin (LIPITOR) 20 MG tablet Take 1 tablet (20 mg total) by mouth daily. 11/12/21   Burchette, Alinda Sierras, MD  buPROPion (WELLBUTRIN XL) 150 MG 24 hr tablet TAKE 1 TABLET BY MOUTH EVERY DAY 09/11/21   Burchette, Alinda Sierras, MD  guaiFENesin (MUCINEX PO) Take by mouth.    [provider]  Multiple Vitamin (MULTIVITAMIN) tablet Take 1 tablet by mouth daily.    [provider]  olopatadine (PATANOL) 0.1 % ophthalmic solution Place 1 drop into both eyes 2 (two) times daily as needed for allergies. 07/26/21   Burchette, Alinda Sierras, MD    Allergies    Crestor [rosuvastatin]  Review of Systems   Review of Systems  Constitutional:  Negative for fever.  HENT: Negative.    Eyes: Negative.   Respiratory:  Positive for shortness of breath.   Cardiovascular:  Positive for leg swelling.  Gastrointestinal:  Negative for abdominal pain and vomiting.  Endocrine: Negative.   Genitourinary: Negative.   Musculoskeletal: Negative.   Skin:  Negative for rash.  Neurological:  Negative for headaches.  All other systems reviewed and  are negative.  Physical Exam Updated Vital Signs BP 136/75 (BP Location: Right Arm)   Pulse 85   Temp 98.3 F (36.8 C) (Oral)   Resp 18   SpO2 100%   Physical Exam Vitals and nursing note reviewed.  Constitutional:      General: She is not in acute distress.    Appearance: She is not ill-appearing.  HENT:     Head: Atraumatic.  Eyes:     Conjunctiva/sclera: Conjunctivae normal.  Cardiovascular:     Rate and Rhythm: Normal rate and regular rhythm.     Pulses: Normal pulses.          Radial pulses are 2+ on the right side and 2+ on the left side.       Dorsalis pedis pulses are 2+ on the right side and 2+ on the left side.     Heart sounds: No murmur heard.    Comments:  2+ nonpitting edema of the right leg.  Left leg normal. Pulmonary:     Effort: Pulmonary effort is normal. No respiratory distress.     Breath sounds: Normal breath sounds.     Comments: Lungs clear to auscultation symmetric and bilaterally.  Normal breath movement.  No wheezes rales or rhonchi.  No accessory muscle use Abdominal:     General: Abdomen is flat. There is no distension.     Palpations: Abdomen is soft.     Tenderness: There is no abdominal tenderness.  Musculoskeletal:        General: Normal range of motion.     Cervical back: Normal range of motion.     Right lower leg: 2+ Edema present.  Skin:    General: Skin is warm and dry.     Capillary Refill: Capillary refill takes less than 2 seconds.  Neurological:     General: No focal deficit present.     Mental Status: She is alert.  Psychiatric:        Mood and Affect: Mood normal.    ED Results / Procedures / Treatments   Labs (all labs ordered are listed, but only abnormal results are displayed) Labs Reviewed  RESP PANEL BY RT-PCR (FLU A&B, COVID) ARPGX2  BASIC METABOLIC PANEL  CBC WITH DIFFERENTIAL/PLATELET  D-DIMER, QUANTITATIVE  TROPONIN I (HIGH SENSITIVITY)  TROPONIN I (HIGH SENSITIVITY)    EKG None  Radiology DG Chest 2  View  Result Date: 11/13/2021 CLINICAL DATA:  Shortness of breath in a 80 year old female. EXAM: CHEST - 2 VIEW COMPARISON:  June of 2017. FINDINGS: Trachea is midline. Stable mildly tortuous thoracic aortic contour. Stable cardiac size. Stable appearance of hilar structures. No  lobar consolidative process. No pneumothorax.  No pleural effusion. On limited assessment there is no acute skeletal process. IMPRESSION: No active cardiopulmonary disease. Electronically Signed   By: Zetta Bills M.D.   On: 11/13/2021 18:34    Procedures Procedures   Medications Ordered in ED Medications - No data to display  ED Course  I have reviewed the triage vital signs and the nursing notes.  Pertinent labs & imaging results that were available during my care of the patient were reviewed by me and considered in my medical decision making (see chart for details).    MDM Rules/Calculators/A&P                         This is an 80 year old female presents for evaluation of increasing shortness of breath and unilateral right leg swelling that has been a chronic issue for several months to years but has been worsening significantly in the last few weeks.  Vitals normal in ED.  Physical exam unremarkable.  Adventitious sounds on lung exam.  Cardiopulmonary exam unremarkable aside from 2+ nonpitting edema of the left lower extremity.  Initial evaluation considering new onset heart failure, MI, pneumonia, bronchitis or PE/DVT.  Chest x-ray showed no cardiomegaly, no cardiopulmonary disease.  D-dimer negative.  BMP and CBC all within normal limits.  Patient negative for COVID and influenza.  Given patient's overall reassuring work-up and physical exam, her symptoms are not consistent with acute MI, pulmonary process, heart failure or PE/DVT.  I suspect that patient's symptoms are most likely due to increasing anxiety from her life stressors.  Advised her to follow-up with her PCP for referral to therapy or for  additional evaluation.  Patient agrees and is amenable to plan.  Discharged home in good condition. Final Clinical Impression(s) / ED Diagnoses Final diagnoses:  Shortness of breath    Rx / DC Orders ED Discharge Orders          Ordered    D-dimer, quantitative  Status:  Canceled        11/13/21 2139             Tonye Pearson, PA-C 11/14/21 0037    Lacretia Leigh, MD 11/19/21 1507

## 2021-11-15 ENCOUNTER — Other Ambulatory Visit: Payer: Self-pay | Admitting: Family Medicine

## 2021-11-20 ENCOUNTER — Telehealth: Payer: Self-pay | Admitting: Pharmacist

## 2021-11-20 NOTE — Chronic Care Management (AMB) (Signed)
    Chronic Care Management Pharmacy Assistant   Name: Heather Oneal  MRN: [id number] DOB: Jan 31, 1941  Reason for Encounter: Schedule follow up appointment. Appointment scheduled for June 2023, patient states she may be moving out of the city and will call if she needs to cancel.   Care Gaps: AWV - message sent to Ramond Craver CMA to schedule Zoster vaccines - never done Covid-19 vaccine - overdue  Flu vaccine - due  [facility] Drugs: Atorvastatin 20mg  - last filled on 11/12/2021 90DS at [facility] Pharmacist Assistant [phone]

## 2021-12-10 DIAGNOSIS — U071 COVID-19: Secondary | ICD-10-CM | POA: Diagnosis not present

## 2021-12-10 DIAGNOSIS — Z03818 Encounter for observation for suspected exposure to other biological agents ruled out: Secondary | ICD-10-CM | POA: Diagnosis not present

## 2021-12-10 DIAGNOSIS — J029 Acute pharyngitis, unspecified: Secondary | ICD-10-CM | POA: Diagnosis not present

## 2021-12-10 DIAGNOSIS — Z20822 Contact with and (suspected) exposure to covid-19: Secondary | ICD-10-CM | POA: Diagnosis not present

## 2021-12-13 ENCOUNTER — Telehealth (INDEPENDENT_AMBULATORY_CARE_PROVIDER_SITE_OTHER): Payer: PPO | Admitting: Family Medicine

## 2021-12-13 DIAGNOSIS — U071 COVID-19: Secondary | ICD-10-CM

## 2021-12-13 NOTE — Progress Notes (Signed)
Patient ID: ALAYAH KNOUFF, female   DOB: Mar 13, 1941, 80 y.o.   MRN: [id number]  This visit type was conducted due to national recommendations for restrictions regarding the COVID-19 pandemic in an effort to limit this patient's exposure and mitigate transmission in our community.   Virtual Visit via Video Note  I connected with [name] on 12/13/21 at 11:00 AM EST by a video enabled telemedicine application and verified that I am speaking with the correct person using two identifiers.  Location patient: home Location provider:work or home office Persons participating in the virtual visit: patient, provider  I discussed the limitations of evaluation and management by telemedicine and the availability of in person appointments. The patient expressed understanding and agreed to proceed.   HPI:  Arbie Cookey has COVID-19 infection.  This is actually second time she has gotten this.  She noted Sunday onset of symptoms of sinus headache, sore throat, nasal congestion.  Had some low-grade fever.  [facility] and [facility].  She was placed promptly on Paxlovid.  She does feel like that has helped.  She actually had also some mild burning with urination during the first part of the week but those symptoms have fully resolved.  She has no fever now.  She is taking over-the-counter Advil and Mucinex.  No dyspnea.  No nausea or vomiting.  No diarrhea.  Sore throat and headache symptoms are improving.  Minimal cough.   ROS: See pertinent positives and negatives per HPI.  Past Medical History:  Diagnosis Date   Allergy    seasonal   Anemia    as a teenager   Anxiety    Depression    DEPRESSION 01/28/2010   History of IBS    Hyperlipidemia    HYPERLIPIDEMIA 01/28/2010    Past Surgical History:  Procedure Laterality Date   APPENDECTOMY  1973   CHOLECYSTECTOMY  2008   COLONOSCOPY     OVARIAN CYST SURGERY  1973   ruptured ovarian cyst   POLYPECTOMY      TONSILLECTOMY  1948    Family History  Problem Relation Age of Onset   Heart disease Mother    Hypertension Mother    Cancer Sister 50       breast   Crohn's disease Brother    Diabetes Maternal Grandmother    Heart disease Paternal Grandfather    Stomach cancer Paternal Grandmother    Colon cancer Neg Hx    Pancreatic cancer Neg Hx    Rectal cancer Neg Hx    Esophageal cancer Neg Hx    Colon polyps Neg Hx     SOCIAL HX: Non-smoker   Current Outpatient Medications:    atorvastatin (LIPITOR) 20 MG tablet, Take 1 tablet (20 mg total) by mouth daily., Disp: 90 tablet, Rfl: 1   buPROPion (WELLBUTRIN XL) 150 MG 24 hr tablet, TAKE 1 TABLET BY MOUTH EVERY DAY, Disp: 30 tablet, Rfl: 1   guaiFENesin (MUCINEX PO), Take by mouth., Disp: , Rfl:    Multiple Vitamin (MULTIVITAMIN) tablet, Take 1 tablet by mouth daily., Disp: , Rfl:    olopatadine (PATANOL) 0.1 % ophthalmic solution, Place 1 drop into both eyes 2 (two) times daily as needed for allergies., Disp: 5 mL, Rfl: 3  EXAM:  VITALS per patient if applicable:  GENERAL: alert, oriented, appears well and in no acute distress  HEENT: atraumatic, conjunttiva clear, no obvious abnormalities on inspection of external nose and ears  NECK: normal movements  of the head and neck  LUNGS: on inspection no signs of respiratory distress, breathing rate appears normal, no obvious gross SOB, gasping or wheezing  CV: no obvious cyanosis  MS: moves all visible extremities without noticeable abnormality  PSYCH/NEURO: pleasant and cooperative, no obvious depression or anxiety, speech and thought processing grossly intact  ASSESSMENT AND PLAN:  Discussed the following assessment and plan:  COVID-19   patient on day 6.  She started Paxlovid 3 days ago and that is helping.  No fever.  Other symptoms improving.  No dyspnea.  She has some mild urinary symptoms as above and those are fully resolved.  We recommend observation for now.  Continue  over-the-counter Mucinex and stay hydrated.  Follow-up for any persistent or worsening symptoms.     I discussed the assessment and treatment plan with the patient. The patient was provided an opportunity to ask questions and all were answered. The patient agreed with the plan and demonstrated an understanding of the instructions.   The patient was advised to call back or seek an in-person evaluation if the symptoms worsen or if the condition fails to improve as anticipated.     Carolann Littler, MD

## 2022-01-26 ENCOUNTER — Other Ambulatory Visit: Payer: Self-pay | Admitting: Family Medicine

## 2022-02-25 ENCOUNTER — Telehealth: Payer: Self-pay | Admitting: Pharmacist

## 2022-02-25 NOTE — Chronic Care Management (AMB) (Signed)
? ? ?  Chronic Care Management ?Pharmacy Assistant  ? ?Name: Heather Oneal  MRN: [id number] DOB: Feb 23, 1941 ? ?Reason for Encounter: Cancelled appointment for 05/26/2022 with patient.  ? ?Gennie Alma CMA  ?Clinical Pharmacist Assistant ?[phone] ? ?

## 2022-03-28 ENCOUNTER — Other Ambulatory Visit: Payer: Self-pay | Admitting: Family Medicine

## 2022-04-25 ENCOUNTER — Telehealth: Payer: Self-pay | Admitting: Pharmacist

## 2022-04-25 NOTE — Chronic Care Management (AMB) (Signed)
    Chronic Care Management Pharmacy Assistant   Name: JEANA KERSTING  MRN: [id number] DOB: [birth year]  Reason for Encounter: Cholesterol Medication Compliance Patient is taking Atorvastatin 20 mg it was last filled 04/21/2022 90 DS at [facility].    [place] Pharmacist Assistant [phone]

## 2022-05-23 ENCOUNTER — Other Ambulatory Visit: Payer: Self-pay | Admitting: Family Medicine

## 2022-05-26 ENCOUNTER — Telehealth: Payer: PPO

## 2022-06-11 ENCOUNTER — Telehealth: Payer: Self-pay | Admitting: Family Medicine

## 2022-06-11 NOTE — Telephone Encounter (Signed)
Left message for patient to call back and schedule Medicare Annual Wellness Visit (AWV) either virtually or in office. Left  my jabber number [phone]   Last AWV ;06/12/21  please schedule at anytime with [facility] Nurse Health Advisor 1 or 2   

## 2022-07-03 ENCOUNTER — Telehealth: Payer: Self-pay | Admitting: Family Medicine

## 2022-07-03 NOTE — Telephone Encounter (Signed)
Tried calling patient to schedule Medicare Annual Wellness Visit (AWV) either virtually or in office. Left  my Herbie Drape number [phone]  No answer   Last AWV 06/12/21 ; please schedule at anytime with [facility] Nurse Health Advisor 1 or 2

## 2022-08-05 ENCOUNTER — Telehealth: Payer: Self-pay | Admitting: Family Medicine

## 2022-08-05 NOTE — Telephone Encounter (Signed)
Left message for patient to call back and schedule Medicare Annual Wellness Visit (AWV) either virtually or in office. Left  my Herbie Drape number [phone]   Last AWV ;06/12/21  please schedule at anytime with [facility] Nurse Health Advisor 1 or 2

## 2022-09-05 ENCOUNTER — Other Ambulatory Visit: Payer: Self-pay | Admitting: Family Medicine

## 2022-09-12 ENCOUNTER — Telehealth: Payer: Self-pay | Admitting: Family Medicine

## 2022-09-12 MED ORDER — BUPROPION HCL ER (XL) 150 MG PO TB24: 150.0000 mg | ORAL_TABLET | Freq: Every day | ORAL | 1 refills | Status: AC

## 2022-09-12 NOTE — Telephone Encounter (Signed)
Rx sent 

## 2022-09-12 NOTE — Telephone Encounter (Signed)
Pt called to request a refill of the following:  buPROPion (WELLBUTRIN XL) 150 MG 24 hr tablet  LOV:  12/13/21    Pt states she moved to [place], [place] and will be looking for a new PCP, but is wondering if MD would be willing to send her at least a 30 day supply?  New Pharmacy: [facility] [address], [place] - [address] Phone:  [phone]  Fax:  [phone]

## 2022-11-02 ENCOUNTER — Other Ambulatory Visit: Payer: Self-pay | Admitting: Family Medicine

## 2022-11-05 ENCOUNTER — Other Ambulatory Visit: Payer: Self-pay | Admitting: Family Medicine

## 2023-07-27 IMAGING — CR DG CHEST 2V
2 series · 2 of 2 positions shown · non-contrast
Comparison: Sunday May, 2016.

CLINICAL DATA: Shortness of breath in a 80-year-old female.

EXAM:
CHEST - 2 VIEW

[w chest pa]
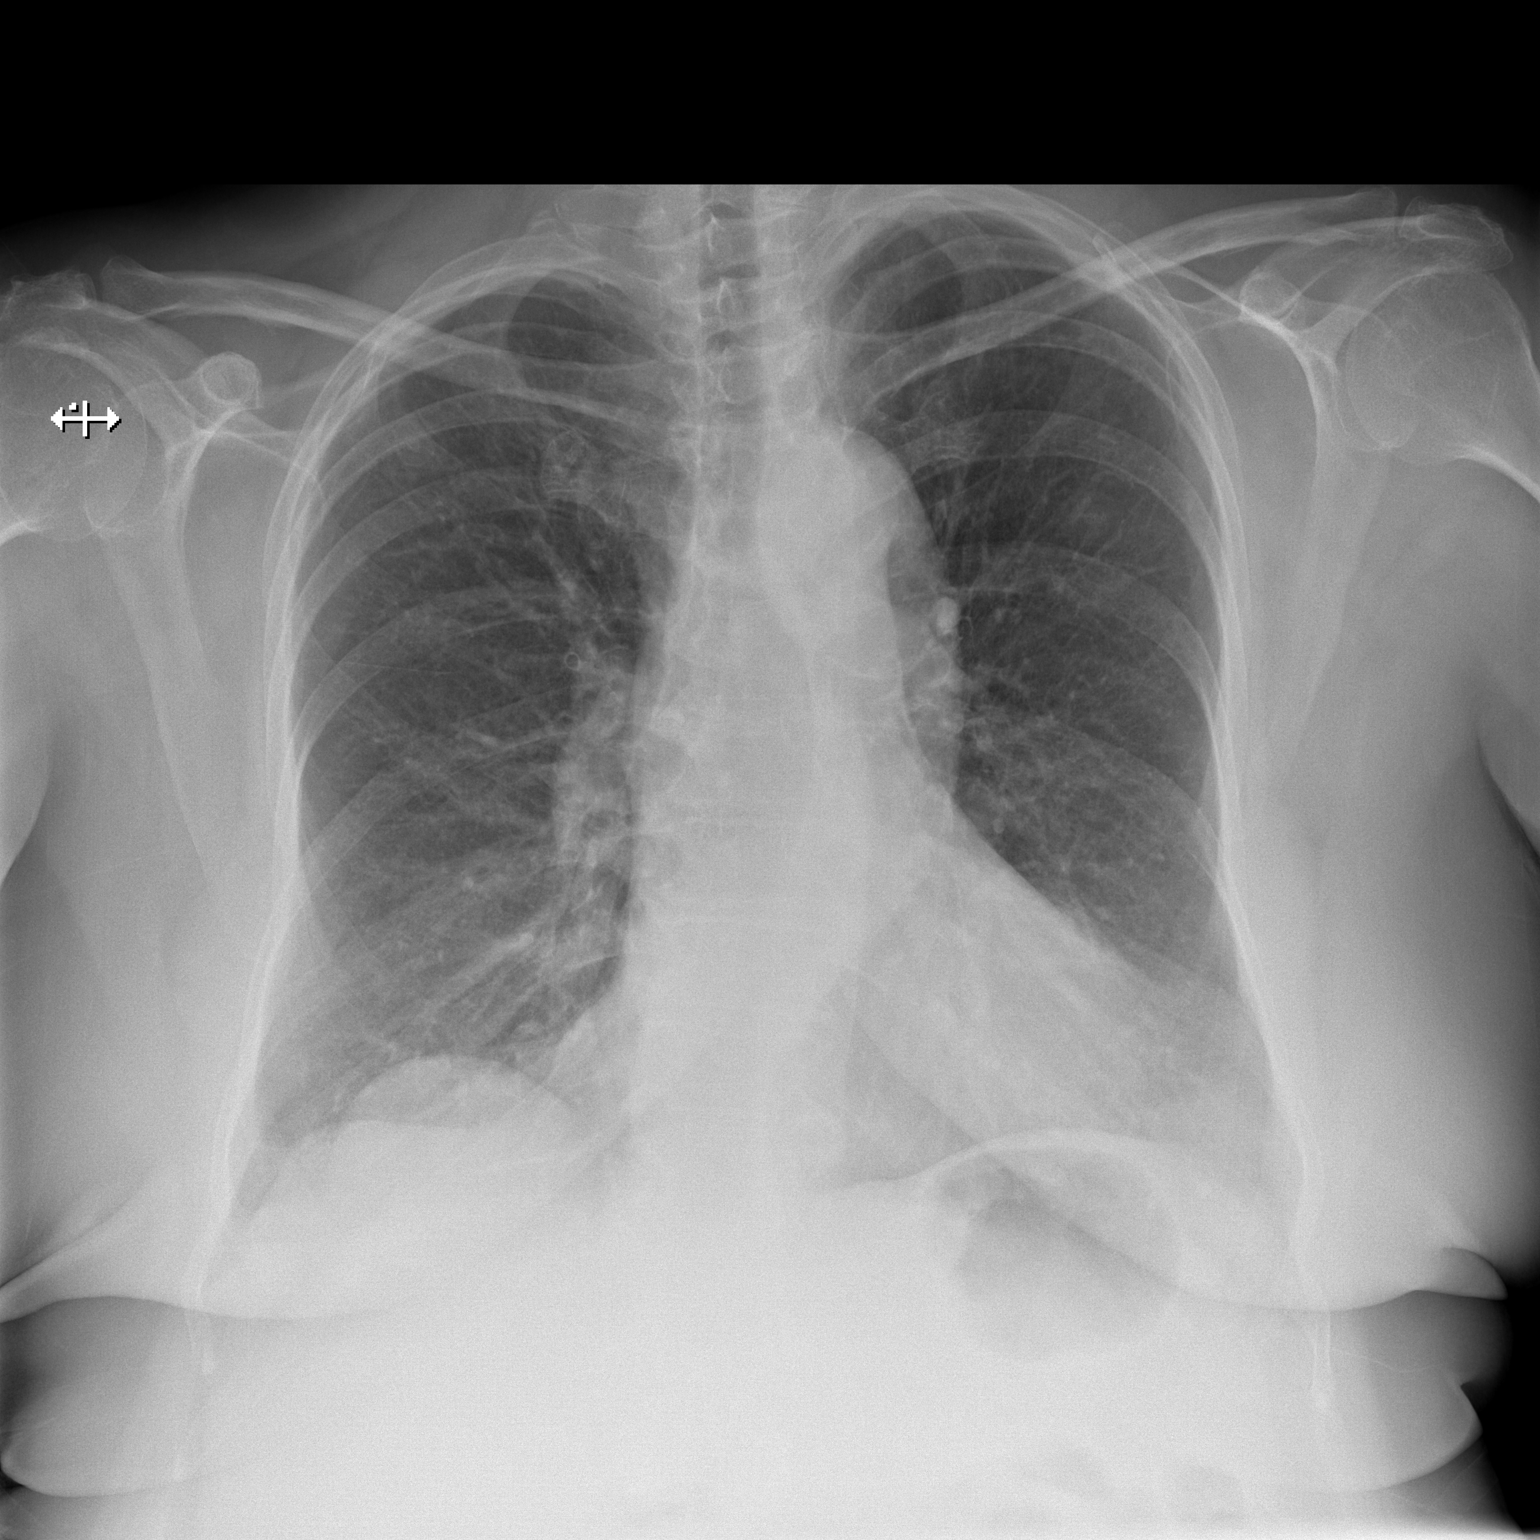

[w chest lat]
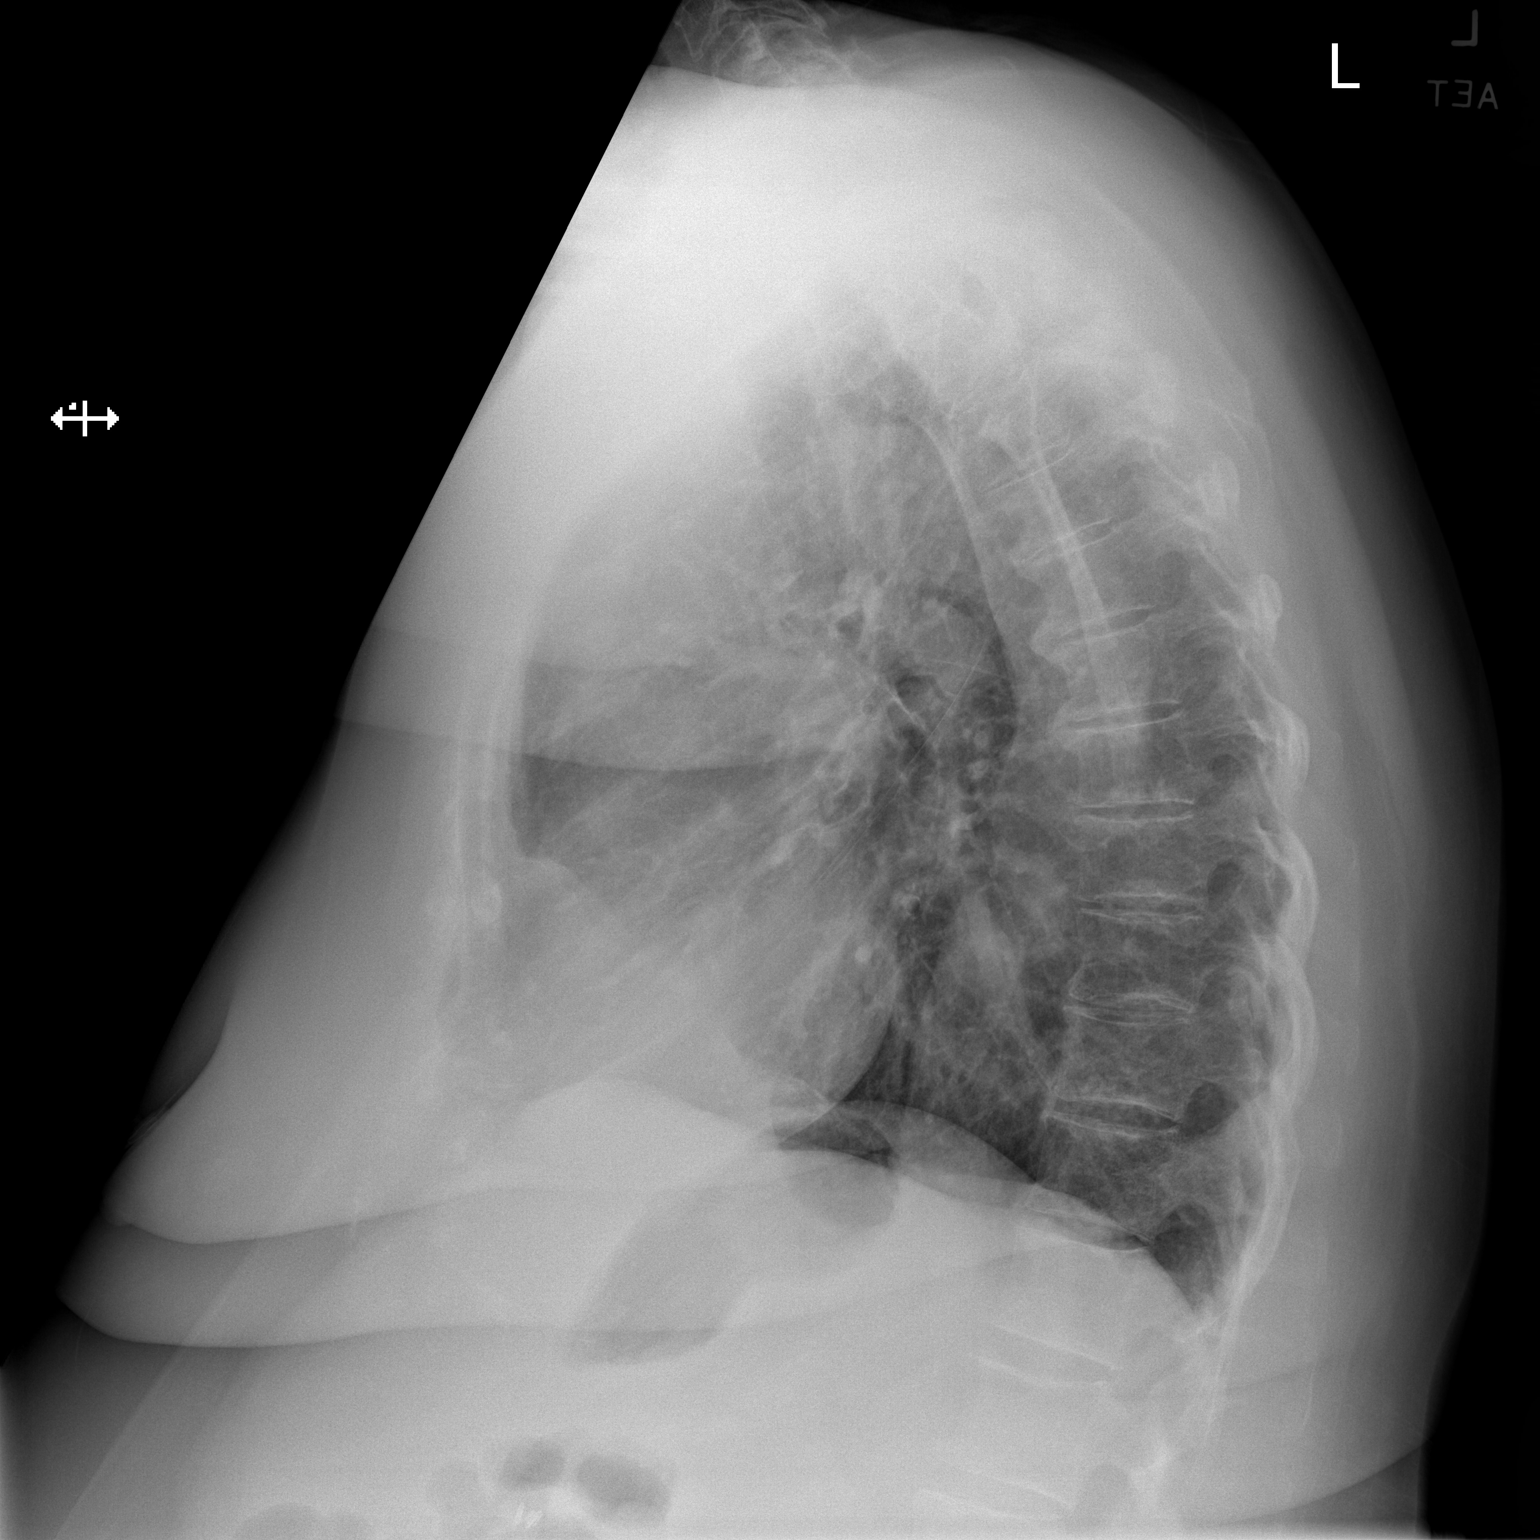

[2 of 2 positions shown; findings below may reference images not displayed]

FINDINGS: Trachea is midline.

Stable mildly tortuous thoracic aortic contour. Stable cardiac size.
Stable appearance of hilar structures. No lobar consolidative
process.

No pneumothorax.  No pleural effusion.

On limited assessment there is no acute skeletal process.
IMPRESSION: No active cardiopulmonary disease.

## 2024-01-14 ENCOUNTER — Telehealth: Payer: Self-pay | Admitting: Family Medicine
